# Patient Record
Sex: Female | Born: 1960
Health system: Southern US, Community
[De-identification: ages and names within clinical notes are randomized; demographics above are authoritative.]

## PROBLEM LIST (undated history)

## (undated) DIAGNOSIS — R9431 Abnormal electrocardiogram [ECG] [EKG]: Secondary | ICD-10-CM

## (undated) DIAGNOSIS — M1990 Unspecified osteoarthritis, unspecified site: Secondary | ICD-10-CM

## (undated) DIAGNOSIS — I10 Essential (primary) hypertension: Secondary | ICD-10-CM

## (undated) DIAGNOSIS — E119 Type 2 diabetes mellitus without complications: Secondary | ICD-10-CM

## (undated) DIAGNOSIS — Z01810 Encounter for preprocedural cardiovascular examination: Secondary | ICD-10-CM

## (undated) DIAGNOSIS — C50919 Malignant neoplasm of unspecified site of unspecified female breast: Secondary | ICD-10-CM

## (undated) HISTORY — PX: CHOLECYSTECTOMY: SHX55

## (undated) HISTORY — DX: Encounter for preprocedural cardiovascular examination: Z01.810

## (undated) HISTORY — PX: BREAST LUMPECTOMY: SHX2

## (undated) HISTORY — DX: Type 2 diabetes mellitus without complications: E11.9

## (undated) HISTORY — DX: Abnormal electrocardiogram (ECG) (EKG): R94.31

## (undated) HISTORY — DX: Essential (primary) hypertension: I10

## (undated) HISTORY — DX: Malignant neoplasm of unspecified site of unspecified female breast: C50.919

## (undated) HISTORY — PX: KNEE SURGERY: SHX244

---

## 2002-01-08 ENCOUNTER — Other Ambulatory Visit: Admission: RE | Admit: 2002-01-08 | Discharge: 2002-01-08 | Payer: Self-pay | Admitting: Family Medicine

## 2003-09-24 ENCOUNTER — Other Ambulatory Visit: Admission: RE | Admit: 2003-09-24 | Discharge: 2003-09-24 | Payer: Self-pay | Admitting: Family Medicine

## 2004-01-04 ENCOUNTER — Ambulatory Visit: Payer: Self-pay | Admitting: Family Medicine

## 2004-05-05 ENCOUNTER — Ambulatory Visit: Payer: Self-pay | Admitting: Family Medicine

## 2005-03-16 ENCOUNTER — Ambulatory Visit: Payer: Self-pay | Admitting: Family Medicine

## 2011-09-11 ENCOUNTER — Other Ambulatory Visit: Payer: Self-pay | Admitting: Vascular Surgery

## 2011-09-11 DIAGNOSIS — R928 Other abnormal and inconclusive findings on diagnostic imaging of breast: Secondary | ICD-10-CM

## 2011-09-15 ENCOUNTER — Ambulatory Visit
Admission: RE | Admit: 2011-09-15 | Discharge: 2011-09-15 | Disposition: A | Discharge status: Home / Self Care | Payer: BC Managed Care – PPO | Source: Ambulatory Visit | Attending: Vascular Surgery | Admitting: Vascular Surgery

## 2011-09-15 DIAGNOSIS — R928 Other abnormal and inconclusive findings on diagnostic imaging of breast: Secondary | ICD-10-CM

## 2014-11-11 DIAGNOSIS — Z853 Personal history of malignant neoplasm of breast: Secondary | ICD-10-CM | POA: Diagnosis not present

## 2015-05-11 DIAGNOSIS — I10 Essential (primary) hypertension: Secondary | ICD-10-CM | POA: Diagnosis not present

## 2015-05-11 DIAGNOSIS — E119 Type 2 diabetes mellitus without complications: Secondary | ICD-10-CM | POA: Diagnosis not present

## 2015-05-12 DIAGNOSIS — Z853 Personal history of malignant neoplasm of breast: Secondary | ICD-10-CM | POA: Diagnosis not present

## 2015-05-18 DIAGNOSIS — B372 Candidiasis of skin and nail: Secondary | ICD-10-CM | POA: Diagnosis not present

## 2015-05-18 DIAGNOSIS — I10 Essential (primary) hypertension: Secondary | ICD-10-CM | POA: Diagnosis not present

## 2015-05-18 DIAGNOSIS — E119 Type 2 diabetes mellitus without complications: Secondary | ICD-10-CM | POA: Diagnosis not present

## 2015-05-18 DIAGNOSIS — M1710 Unilateral primary osteoarthritis, unspecified knee: Secondary | ICD-10-CM | POA: Diagnosis not present

## 2015-08-18 DIAGNOSIS — C50219 Malignant neoplasm of upper-inner quadrant of unspecified female breast: Secondary | ICD-10-CM | POA: Diagnosis not present

## 2015-08-18 DIAGNOSIS — Z9889 Other specified postprocedural states: Secondary | ICD-10-CM | POA: Diagnosis not present

## 2015-08-18 DIAGNOSIS — R928 Other abnormal and inconclusive findings on diagnostic imaging of breast: Secondary | ICD-10-CM | POA: Diagnosis not present

## 2015-08-31 DIAGNOSIS — Z171 Estrogen receptor negative status [ER-]: Secondary | ICD-10-CM | POA: Diagnosis not present

## 2015-08-31 DIAGNOSIS — C50311 Malignant neoplasm of lower-inner quadrant of right female breast: Secondary | ICD-10-CM | POA: Diagnosis not present

## 2015-08-31 DIAGNOSIS — Z6841 Body Mass Index (BMI) 40.0 and over, adult: Secondary | ICD-10-CM | POA: Diagnosis not present

## 2015-09-14 DIAGNOSIS — M25561 Pain in right knee: Secondary | ICD-10-CM | POA: Diagnosis not present

## 2015-10-14 DIAGNOSIS — M1711 Unilateral primary osteoarthritis, right knee: Secondary | ICD-10-CM | POA: Diagnosis not present

## 2015-11-17 DIAGNOSIS — I10 Essential (primary) hypertension: Secondary | ICD-10-CM | POA: Diagnosis not present

## 2015-11-17 DIAGNOSIS — E119 Type 2 diabetes mellitus without complications: Secondary | ICD-10-CM | POA: Diagnosis not present

## 2015-11-18 DIAGNOSIS — L814 Other melanin hyperpigmentation: Secondary | ICD-10-CM | POA: Diagnosis not present

## 2015-11-18 DIAGNOSIS — D1801 Hemangioma of skin and subcutaneous tissue: Secondary | ICD-10-CM | POA: Diagnosis not present

## 2015-11-18 DIAGNOSIS — L578 Other skin changes due to chronic exposure to nonionizing radiation: Secondary | ICD-10-CM | POA: Diagnosis not present

## 2015-11-24 DIAGNOSIS — M1710 Unilateral primary osteoarthritis, unspecified knee: Secondary | ICD-10-CM | POA: Diagnosis not present

## 2015-11-24 DIAGNOSIS — E119 Type 2 diabetes mellitus without complications: Secondary | ICD-10-CM | POA: Diagnosis not present

## 2015-11-24 DIAGNOSIS — I10 Essential (primary) hypertension: Secondary | ICD-10-CM | POA: Diagnosis not present

## 2015-11-24 DIAGNOSIS — B372 Candidiasis of skin and nail: Secondary | ICD-10-CM | POA: Diagnosis not present

## 2015-11-29 DIAGNOSIS — Z853 Personal history of malignant neoplasm of breast: Secondary | ICD-10-CM | POA: Diagnosis not present

## 2016-04-05 DIAGNOSIS — E119 Type 2 diabetes mellitus without complications: Secondary | ICD-10-CM | POA: Diagnosis not present

## 2016-05-29 DIAGNOSIS — Z853 Personal history of malignant neoplasm of breast: Secondary | ICD-10-CM | POA: Diagnosis not present

## 2016-05-29 DIAGNOSIS — Z170 Estrogen receptor positive status [ER+]: Secondary | ICD-10-CM | POA: Diagnosis not present

## 2016-08-25 DIAGNOSIS — R922 Inconclusive mammogram: Secondary | ICD-10-CM | POA: Diagnosis not present

## 2016-08-25 DIAGNOSIS — Z9889 Other specified postprocedural states: Secondary | ICD-10-CM | POA: Diagnosis not present

## 2016-08-25 DIAGNOSIS — C50211 Malignant neoplasm of upper-inner quadrant of right female breast: Secondary | ICD-10-CM | POA: Diagnosis not present

## 2016-09-04 DIAGNOSIS — C50311 Malignant neoplasm of lower-inner quadrant of right female breast: Secondary | ICD-10-CM | POA: Diagnosis not present

## 2016-09-04 DIAGNOSIS — Z171 Estrogen receptor negative status [ER-]: Secondary | ICD-10-CM | POA: Diagnosis not present

## 2016-09-04 DIAGNOSIS — Z6838 Body mass index (BMI) 38.0-38.9, adult: Secondary | ICD-10-CM | POA: Diagnosis not present

## 2016-11-28 DIAGNOSIS — Z170 Estrogen receptor positive status [ER+]: Secondary | ICD-10-CM | POA: Diagnosis not present

## 2016-11-28 DIAGNOSIS — Z853 Personal history of malignant neoplasm of breast: Secondary | ICD-10-CM | POA: Diagnosis not present

## 2017-01-11 DIAGNOSIS — I10 Essential (primary) hypertension: Secondary | ICD-10-CM | POA: Diagnosis not present

## 2017-01-11 DIAGNOSIS — E119 Type 2 diabetes mellitus without complications: Secondary | ICD-10-CM | POA: Diagnosis not present

## 2017-01-11 DIAGNOSIS — Z79899 Other long term (current) drug therapy: Secondary | ICD-10-CM | POA: Diagnosis not present

## 2017-01-11 DIAGNOSIS — Z13220 Encounter for screening for lipoid disorders: Secondary | ICD-10-CM | POA: Diagnosis not present

## 2017-01-26 DIAGNOSIS — Z23 Encounter for immunization: Secondary | ICD-10-CM | POA: Diagnosis not present

## 2017-04-09 DIAGNOSIS — E785 Hyperlipidemia, unspecified: Secondary | ICD-10-CM | POA: Diagnosis not present

## 2017-04-09 DIAGNOSIS — I10 Essential (primary) hypertension: Secondary | ICD-10-CM | POA: Diagnosis not present

## 2017-04-09 DIAGNOSIS — E119 Type 2 diabetes mellitus without complications: Secondary | ICD-10-CM | POA: Diagnosis not present

## 2017-06-06 DIAGNOSIS — M25561 Pain in right knee: Secondary | ICD-10-CM | POA: Diagnosis not present

## 2017-06-06 DIAGNOSIS — M1711 Unilateral primary osteoarthritis, right knee: Secondary | ICD-10-CM | POA: Diagnosis not present

## 2017-06-06 DIAGNOSIS — M1712 Unilateral primary osteoarthritis, left knee: Secondary | ICD-10-CM | POA: Diagnosis not present

## 2017-06-06 DIAGNOSIS — M25562 Pain in left knee: Secondary | ICD-10-CM | POA: Diagnosis not present

## 2017-06-11 DIAGNOSIS — E113293 Type 2 diabetes mellitus with mild nonproliferative diabetic retinopathy without macular edema, bilateral: Secondary | ICD-10-CM | POA: Diagnosis not present

## 2017-07-10 DIAGNOSIS — M1711 Unilateral primary osteoarthritis, right knee: Secondary | ICD-10-CM | POA: Diagnosis not present

## 2017-07-10 DIAGNOSIS — E785 Hyperlipidemia, unspecified: Secondary | ICD-10-CM | POA: Diagnosis not present

## 2017-07-10 DIAGNOSIS — I10 Essential (primary) hypertension: Secondary | ICD-10-CM | POA: Diagnosis not present

## 2017-07-10 DIAGNOSIS — M1712 Unilateral primary osteoarthritis, left knee: Secondary | ICD-10-CM | POA: Diagnosis not present

## 2017-07-10 DIAGNOSIS — E119 Type 2 diabetes mellitus without complications: Secondary | ICD-10-CM | POA: Diagnosis not present

## 2017-07-10 DIAGNOSIS — Z79899 Other long term (current) drug therapy: Secondary | ICD-10-CM | POA: Diagnosis not present

## 2017-07-17 DIAGNOSIS — M1712 Unilateral primary osteoarthritis, left knee: Secondary | ICD-10-CM | POA: Diagnosis not present

## 2017-07-17 DIAGNOSIS — M1711 Unilateral primary osteoarthritis, right knee: Secondary | ICD-10-CM | POA: Diagnosis not present

## 2017-07-24 DIAGNOSIS — M1711 Unilateral primary osteoarthritis, right knee: Secondary | ICD-10-CM | POA: Diagnosis not present

## 2017-07-24 DIAGNOSIS — M1712 Unilateral primary osteoarthritis, left knee: Secondary | ICD-10-CM | POA: Diagnosis not present

## 2017-07-26 DIAGNOSIS — E119 Type 2 diabetes mellitus without complications: Secondary | ICD-10-CM | POA: Diagnosis not present

## 2017-08-27 DIAGNOSIS — C50311 Malignant neoplasm of lower-inner quadrant of right female breast: Secondary | ICD-10-CM | POA: Diagnosis not present

## 2017-08-27 DIAGNOSIS — Z9889 Other specified postprocedural states: Secondary | ICD-10-CM | POA: Diagnosis not present

## 2017-08-27 DIAGNOSIS — Z853 Personal history of malignant neoplasm of breast: Secondary | ICD-10-CM | POA: Diagnosis not present

## 2017-09-04 DIAGNOSIS — M1712 Unilateral primary osteoarthritis, left knee: Secondary | ICD-10-CM | POA: Diagnosis not present

## 2017-09-04 DIAGNOSIS — M1711 Unilateral primary osteoarthritis, right knee: Secondary | ICD-10-CM | POA: Diagnosis not present

## 2017-09-20 DIAGNOSIS — E119 Type 2 diabetes mellitus without complications: Secondary | ICD-10-CM | POA: Diagnosis not present

## 2017-09-20 DIAGNOSIS — Z6838 Body mass index (BMI) 38.0-38.9, adult: Secondary | ICD-10-CM | POA: Diagnosis not present

## 2017-09-20 DIAGNOSIS — Z01818 Encounter for other preprocedural examination: Secondary | ICD-10-CM | POA: Diagnosis not present

## 2017-09-20 DIAGNOSIS — I10 Essential (primary) hypertension: Secondary | ICD-10-CM | POA: Diagnosis not present

## 2017-09-24 DIAGNOSIS — C50919 Malignant neoplasm of unspecified site of unspecified female breast: Secondary | ICD-10-CM

## 2017-09-24 DIAGNOSIS — I10 Essential (primary) hypertension: Secondary | ICD-10-CM

## 2017-09-24 DIAGNOSIS — R9431 Abnormal electrocardiogram [ECG] [EKG]: Secondary | ICD-10-CM

## 2017-09-24 DIAGNOSIS — E119 Type 2 diabetes mellitus without complications: Secondary | ICD-10-CM

## 2017-09-24 DIAGNOSIS — Z01810 Encounter for preprocedural cardiovascular examination: Secondary | ICD-10-CM

## 2017-09-24 HISTORY — DX: Type 2 diabetes mellitus without complications: E11.9

## 2017-09-24 HISTORY — DX: Essential (primary) hypertension: I10

## 2017-09-24 HISTORY — DX: Malignant neoplasm of unspecified site of unspecified female breast: C50.919

## 2017-09-24 HISTORY — DX: Abnormal electrocardiogram (ECG) (EKG): R94.31

## 2017-09-24 HISTORY — DX: Encounter for preprocedural cardiovascular examination: Z01.810

## 2017-10-01 NOTE — Progress Notes (Addendum)
Cardiology Office Note:    Date:  10/02/2017   ID:  Samantha Potter, DOB 09-Mar-1960, MRN [id number]  PCP:  Ernestene Kiel, MD  Cardiologist:  Shirlee More, MD   Referring MD: Janie Morning, DO  ASSESSMENT:    1. Abnormal EKG   2. Preop cardiovascular exam   3. Essential hypertension   4. Type 2 diabetes mellitus with complication, without long-term current use of insulin ([place])   5. Hx antineoplastic chemo    PLAN:    In order of problems listed above:   Echo is normal, proceed with TKA.  1. Her EKG is nonspecific but with her history of chemotherapy and radiation would like her to have an echocardiogram and if function is normal and no valvular abnormality noted proceed with her planned total knee arthroplasty and I do not think she needs further evaluation such as an ischemic test stress test. 2. The surgery is intermediate risk and elective she has no known heart disease but is nonspecific EKG changes and history of combined chemotherapy and radiation.  Her exercise tolerance is well preserved she will undergo an echocardiogram and if reassuring proceed with surgery 3. Stable continue lisinopril 4. Stable continue metformin 5. An increased cardiovascular risk echocardiogram is appropriate if reassuring proceed with her planned total knee arthroplasty.  I do not think she needs further evaluation such as a stress test  Next appointment as needed   Medication Adjustments/Labs and Tests Ordered: Current medicines are reviewed at length with the patient today.  Concerns regarding medicines are outlined above.  Orders Placed This Encounter  Procedures  . EKG 12-Lead  . ECHOCARDIOGRAM COMPLETE   No orders of the defined types were placed in this encounter.    Chief Complaint  Patient presents with  . New Patient (Initial Visit)  . Abnormal ECG  I am awaiting total knee arthroplasty  History of Present Illness:    Samantha Potter is a 57 y.o. female who is being  seen today for preoperative evaluation for TKA at the request of  Bertram Savin (Attending) [phone] (Work) [address] [place], [place] [id number] Orthopaedic Surgery  Preoperative EKG was read as abnormal old anterior MI my interpretation is normal  She has no known history of heart disease but received chemotherapy in 2013 likely Adriamycin for breast cancer combined with radiation is at increased cardiovascular risk.  She has had no exercise intolerance dyspnea chest pain palpitations syncope or TIA she still remains active despite her knee pain and limitations in her exercise tolerance is 5 mets or greater.  She has no history of congenital rheumatic heart disease or other risk factors include hypertension and type 2 diabetes Past Medical History:  Diagnosis Date  . Abnormal EKG 09/24/2017  . Breast cancer ([place]) 09/24/2017  . Diabetes ([place]) 09/24/2017  . Essential hypertension 09/24/2017  . Preop cardiovascular exam 09/24/2017    Past Surgical History:  Procedure Laterality Date  . BREAST LUMPECTOMY    . CHOLECYSTECTOMY    . KNEE SURGERY      Current Medications: Current Meds  Medication Sig  . acetaminophen (TYLENOL) 500 MG tablet Take 500 mg by mouth 2 (two) times daily.  Marland Kitchen CINNAMON PO Take 1 tablet by mouth daily.  . cyclobenzaprine (FLEXERIL) 5 MG tablet Take 5 mg by mouth 3 (three) times daily as needed.  Marland Kitchen lisinopril (PRINIVIL,ZESTRIL) 5 MG tablet Take 5 mg by mouth daily.  . metFORMIN (GLUCOPHAGE) 500 MG tablet Take 2 tablets by mouth  2 (two) times daily.     Allergies:   Penicillins   Social History   Socioeconomic History  . Marital status: Married    Spouse name: Not on file  . Number of children: Not on file  . Years of education: Not on file  . Highest education level: Not on file  Occupational History  . Not on file  Social Needs  . Financial resource strain: Not on file  . Food insecurity:    Worry: Not on file    Inability: Not  on file  . Transportation needs:    Medical: Not on file    Non-medical: Not on file  Tobacco Use  . Smoking status: Never Smoker  . Smokeless tobacco: Never Used  Substance and Sexual Activity  . Alcohol use: Never    Frequency: Never  . Drug use: Never  . Sexual activity: Not on file  Lifestyle  . Physical activity:    Days per week: Not on file    Minutes per session: Not on file  . Stress: Not on file  Relationships  . Social connections:    Talks on phone: Not on file    Gets together: Not on file    Attends religious service: Not on file    Active member of club or organization: Not on file    Attends meetings of clubs or organizations: Not on file    Relationship status: Not on file  Other Topics Concern  . Not on file  Social History Narrative  . Not on file     Family History: The patient's family history includes Bladder Cancer in her father; Diabetes in her paternal aunt, paternal grandmother, and paternal uncle; Heart disease in her paternal uncle; Hypertension in her mother and paternal uncle.  ROS:   Review of Systems  Constitution: Negative.  HENT: Negative.   Eyes: Negative.   Cardiovascular: Negative.   Respiratory: Negative.   Endocrine: Negative.   Hematologic/Lymphatic: Negative.   Skin: Negative.   Musculoskeletal: Positive for joint pain.  Gastrointestinal: Negative.   Genitourinary: Negative.   Neurological: Negative.   Psychiatric/Behavioral: Negative.   Allergic/Immunologic: Negative.    Please see the history of present illness.     All other systems reviewed and are negative.  EKGs/Labs/Other Studies Reviewed:    The following studies were reviewed today:   EKG:  EKG is  ordered today.  The ekg ordered today demonstrates sinus rhythm poor R wave progression variant of normal  Recent Labs: No results found for requested labs within last 8760 hours.  Recent Lipid Panel No results found for: CHOL, TRIG, HDL, CHOLHDL, VLDL,  LDLCALC, LDLDIRECT  Physical Exam:    VS:  BP (!) 150/90 (BP Location: Right Arm, Patient Position: Sitting, Cuff Size: Large)   Pulse 94   Ht 5\' 4"  (1.626 m)   Wt 223 lb 3.2 oz (101.2 kg)   SpO2 98%   BMI 38.31 kg/m     Wt Readings from Last 3 Encounters:  10/02/17 223 lb 3.2 oz (101.2 kg)     GEN:  Well nourished, well developed in no acute distress HEENT: Normal NECK: No JVD; No carotid bruits LYMPHATICS: No lymphadenopathy CARDIAC: RRR, no murmurs, rubs, gallops RESPIRATORY:  Clear to auscultation without rales, wheezing or rhonchi  ABDOMEN: Soft, non-tender, non-distended MUSCULOSKELETAL:  No edema; No deformity  SKIN: Warm and dry NEUROLOGIC:  Alert and oriented x 3 PSYCHIATRIC:  Normal affect     Signed, Shirlee More, MD  10/02/2017 9:39 AM     [facility]

## 2017-10-02 ENCOUNTER — Encounter: Payer: Self-pay | Admitting: Cardiology

## 2017-10-02 ENCOUNTER — Encounter (INDEPENDENT_AMBULATORY_CARE_PROVIDER_SITE_OTHER): Payer: Self-pay

## 2017-10-02 ENCOUNTER — Ambulatory Visit: Payer: BLUE CROSS/BLUE SHIELD | Admitting: Cardiology

## 2017-10-02 VITALS — BP 140/88 | HR 94 | Ht 64.0 in | Wt 223.2 lb

## 2017-10-02 DIAGNOSIS — I10 Essential (primary) hypertension: Secondary | ICD-10-CM | POA: Diagnosis not present

## 2017-10-02 DIAGNOSIS — E118 Type 2 diabetes mellitus with unspecified complications: Secondary | ICD-10-CM

## 2017-10-02 DIAGNOSIS — R9431 Abnormal electrocardiogram [ECG] [EKG]: Secondary | ICD-10-CM | POA: Diagnosis not present

## 2017-10-02 DIAGNOSIS — Z9221 Personal history of antineoplastic chemotherapy: Secondary | ICD-10-CM

## 2017-10-02 DIAGNOSIS — Z01810 Encounter for preprocedural cardiovascular examination: Secondary | ICD-10-CM

## 2017-10-02 NOTE — Patient Instructions (Signed)
Medication Instructions:  Your physician recommends that you continue on your current medications as directed. Please refer to the Current Medication list given to you today.   Labwork: None.  Testing/Procedures: Your physician has requested that you have an echocardiogram. Echocardiography is a painless test that uses sound waves to create images of your heart. It provides your doctor with information about the size and shape of your heart and how well your heart's chambers and valves are working. This procedure takes approximately one hour. There are no restrictions for this procedure.    Follow-Up: Follow up as needed.   Any Other Special Instructions Will Be Listed Below (If Applicable).     If you need a refill on your cardiac medications before your next appointment, please call your pharmacy.   Echocardiogram An echocardiogram, or echocardiography, uses sound waves (ultrasound) to produce an image of your heart. The echocardiogram is simple, painless, obtained within a short period of time, and offers valuable information to your health care provider. The images from an echocardiogram can provide information such as:  Evidence of coronary artery disease (CAD).  Heart size.  Heart muscle function.  Heart valve function.  Aneurysm detection.  Evidence of a past heart attack.  Fluid buildup around the heart.  Heart muscle thickening.  Assess heart valve function.  Tell a health care provider about:  Any allergies you have.  All medicines you are taking, including vitamins, herbs, eye drops, creams, and over-the-counter medicines.  Any problems you or family members have had with anesthetic medicines.  Any blood disorders you have.  Any surgeries you have had.  Any medical conditions you have.  Whether you are pregnant or may be pregnant. What happens before the procedure? No special preparation is needed. Eat and drink normally. What happens during the  procedure?  In order to produce an image of your heart, gel will be applied to your chest and a wand-like tool (transducer) will be moved over your chest. The gel will help transmit the sound waves from the transducer. The sound waves will harmlessly bounce off your heart to allow the heart images to be captured in real-time motion. These images will then be recorded.  You may need an IV to receive a medicine that improves the quality of the pictures. What happens after the procedure? You may return to your normal schedule including diet, activities, and medicines, unless your health care provider tells you otherwise. This information is not intended to replace advice given to you by your health care provider. Make sure you discuss any questions you have with your health care provider. Document Released: 01/21/2000 Document Revised: 09/11/2015 Document Reviewed: 09/30/2012 Elsevier Interactive Patient Education  2017 [employer].

## 2017-10-10 DIAGNOSIS — Z79899 Other long term (current) drug therapy: Secondary | ICD-10-CM | POA: Diagnosis not present

## 2017-10-10 DIAGNOSIS — E785 Hyperlipidemia, unspecified: Secondary | ICD-10-CM | POA: Diagnosis not present

## 2017-10-10 DIAGNOSIS — Z6838 Body mass index (BMI) 38.0-38.9, adult: Secondary | ICD-10-CM | POA: Diagnosis not present

## 2017-10-10 DIAGNOSIS — E119 Type 2 diabetes mellitus without complications: Secondary | ICD-10-CM | POA: Diagnosis not present

## 2017-10-11 ENCOUNTER — Ambulatory Visit (HOSPITAL_BASED_OUTPATIENT_CLINIC_OR_DEPARTMENT_OTHER)
Admission: RE | Admit: 2017-10-11 | Discharge: 2017-10-11 | Disposition: A | Discharge status: Home / Self Care | Payer: BLUE CROSS/BLUE SHIELD | Source: Ambulatory Visit | Attending: Cardiology | Admitting: Cardiology

## 2017-10-11 DIAGNOSIS — E119 Type 2 diabetes mellitus without complications: Secondary | ICD-10-CM | POA: Diagnosis not present

## 2017-10-11 DIAGNOSIS — R9431 Abnormal electrocardiogram [ECG] [EKG]: Secondary | ICD-10-CM | POA: Insufficient documentation

## 2017-10-11 DIAGNOSIS — I119 Hypertensive heart disease without heart failure: Secondary | ICD-10-CM | POA: Diagnosis not present

## 2017-10-11 NOTE — Progress Notes (Signed)
  Echocardiogram 2D Echocardiogram has been performed.  Samantha Potter T Fe Okubo 10/11/2017, 11:30 AM

## 2017-11-01 ENCOUNTER — Telehealth: Payer: Self-pay | Admitting: Cardiology

## 2017-11-01 NOTE — Telephone Encounter (Signed)
Patient states that knee surgeon needs clearance for Knee surgery by Dr Rod Can and the patient states they have not heard anything and they need information.

## 2017-11-01 NOTE — Telephone Encounter (Signed)
Patient informed that Dr. Bettina Gavia had informed Dr. Lyla Glassing that she was cleared for surgery from a cardiac standpoint on 10/11/17 after echocardiogram. Advised patient that all of this information has been resent to Dr. Lyla Glassing and she should contact his office tomorrow to make sure they received it. Advised her to contact our office with any further questions or concerns. Patient verbalized understanding. No further questions.

## 2017-11-09 ENCOUNTER — Ambulatory Visit: Payer: Self-pay | Admitting: Orthopedic Surgery

## 2017-11-12 NOTE — Patient Instructions (Addendum)
Samantha Potter  11/12/2017   Your procedure is scheduled on: 11-22-17   Report to [facility] Main  Entrance     Report to admitting at 8:00AM    Call this number if you have problems the morning of surgery [phone]     Remember: Do not eat food or drink liquids :After Midnight. BRUSH YOUR TEETH MORNING OF SURGERY AND RINSE YOUR MOUTH OUT, NO CHEWING GUM CANDY OR MINTS.     Take these medicines the morning of surgery with A SIP OF WATER: tylenol if needed               You may not have any metal on your body including hair pins and              piercings  Do not wear jewelry, make-up, lotions, powders or perfumes, deodorant             Do not wear nail polish.  Do not shave  48 hours prior to surgery.     Do not bring valuables to the hospital. [place].  Contacts, dentures or bridgework may not be worn into surgery.  Leave suitcase in the car. After surgery it may be brought to your room.                   Please read over the following fact sheets you were given: _____________________________________________________________________             [facility] - Preparing for Surgery Before surgery, you can play an important role.  Because skin is not sterile, your skin needs to be as free of germs as possible.  You can reduce the number of germs on your skin by washing with CHG (chlorahexidine gluconate) soap before surgery.  CHG is an antiseptic cleaner which kills germs and bonds with the skin to continue killing germs even after washing. Please DO NOT use if you have an allergy to CHG or antibacterial soaps.  If your skin becomes reddened/irritated stop using the CHG and inform your nurse when you arrive at Short Stay. Do not shave (including legs and underarms) for at least 48 hours prior to the first CHG shower.  You may shave your face/neck. Please follow these instructions carefully:  1.   Shower with CHG Soap the night before surgery and the  morning of Surgery.  2.  If you choose to wash your hair, wash your hair first as usual with your  normal  shampoo.  3.  After you shampoo, rinse your hair and body thoroughly to remove the  shampoo.                           4.  Use CHG as you would any other liquid soap.  You can apply chg directly  to the skin and wash                       Gently with a scrungie or clean washcloth.  5.  Apply the CHG Soap to your body ONLY FROM THE NECK DOWN.   Do not use on face/ open  Wound or open sores. Avoid contact with eyes, ears mouth and genitals (private parts).                       Wash face,  Genitals (private parts) with your normal soap.             6.  Wash thoroughly, paying special attention to the area where your surgery  will be performed.  7.  Thoroughly rinse your body with warm water from the neck down.  8.  DO NOT shower/wash with your normal soap after using and rinsing off  the CHG Soap.                9.  Pat yourself dry with a clean towel.            10.  Wear clean pajamas.            11.  Place clean sheets on your bed the night of your first shower and do not  sleep with pets. Day of Surgery : Do not apply any lotions/deodorants the morning of surgery.  Please wear clean clothes to the hospital/surgery center.  FAILURE TO FOLLOW THESE INSTRUCTIONS MAY RESULT IN THE CANCELLATION OF YOUR SURGERY PATIENT SIGNATURE_________________________________  NURSE SIGNATURE__________________________________  ________________________________________________________________________   Samantha Potter  An incentive spirometer is a tool that can help keep your lungs clear and active. This tool measures how well you are filling your lungs with each breath. Taking long deep breaths may help reverse or decrease the chance of developing breathing (pulmonary) problems (especially infection) following:  A long  period of time when you are unable to move or be active. BEFORE THE PROCEDURE   If the spirometer includes an indicator to show your best effort, your nurse or respiratory therapist will set it to a desired goal.  If possible, sit up straight or lean slightly forward. Try not to slouch.  Hold the incentive spirometer in an upright position. INSTRUCTIONS FOR USE  1. Sit on the edge of your bed if possible, or sit up as far as you can in bed or on a chair. 2. Hold the incentive spirometer in an upright position. 3. Breathe out normally. 4. Place the mouthpiece in your mouth and seal your lips tightly around it. 5. Breathe in slowly and as deeply as possible, raising the piston or the ball toward the top of the column. 6. Hold your breath for 3-5 seconds or for as long as possible. Allow the piston or ball to fall to the bottom of the column. 7. Remove the mouthpiece from your mouth and breathe out normally. 8. Rest for a few seconds and repeat Steps 1 through 7 at least 10 times every 1-2 hours when you are awake. Take your time and take a few normal breaths between deep breaths. 9. The spirometer may include an indicator to show your best effort. Use the indicator as a goal to work toward during each repetition. 10. After each set of 10 deep breaths, practice coughing to be sure your lungs are clear. If you have an incision (the cut made at the time of surgery), support your incision when coughing by placing a pillow or rolled up towels firmly against it. Once you are able to get out of bed, walk around indoors and cough well. You may stop using the incentive spirometer when instructed by your caregiver.  RISKS AND COMPLICATIONS  Take your time so you do not get  dizzy or light-headed.  If you are in pain, you may need to take or ask for pain medication before doing incentive spirometry. It is harder to take a deep breath if you are having pain. AFTER USE  Rest and breathe slowly and  easily.  It can be helpful to keep track of a log of your progress. Your caregiver can provide you with a simple table to help with this. If you are using the spirometer at home, follow these instructions: [place] IF:   You are having difficultly using the spirometer.  You have trouble using the spirometer as often as instructed.  Your pain medication is not giving enough relief while using the spirometer.  You develop fever of 100.5 F (38.1 C) or higher. SEEK IMMEDIATE MEDICAL CARE IF:   You cough up bloody sputum that had not been present before.  You develop fever of 102 F (38.9 C) or greater.  You develop worsening pain at or near the incision site. MAKE SURE YOU:   Understand these instructions.  Will watch your condition.  Will get help right away if you are not doing well or get worse. Document Released: 06/05/2006 Document Revised: 04/17/2011 Document Reviewed: 08/06/2006 ExitCare Patient Information 2014 ExitCare, [place].   ________________________________________________________________________  WHAT IS A BLOOD TRANSFUSION? Blood Transfusion Information  A transfusion is the replacement of blood or some of its parts. Blood is made up of multiple cells which provide different functions.  Red blood cells carry oxygen and are used for blood loss replacement.  White blood cells fight against infection.  Platelets control bleeding.  Plasma helps clot blood.  Other blood products are available for specialized needs, such as hemophilia or other clotting disorders. BEFORE THE TRANSFUSION  Who gives blood for transfusions?   Healthy volunteers who are fully evaluated to make sure their blood is safe. This is blood bank blood. Transfusion therapy is the safest it has ever been in the practice of medicine. Before blood is taken from a donor, a complete history is taken to make sure that person has no history of diseases nor engages in risky social  behavior (examples are intravenous drug use or sexual activity with multiple partners). The donor's travel history is screened to minimize risk of transmitting infections, such as malaria. The donated blood is tested for signs of infectious diseases, such as HIV and hepatitis. The blood is then tested to be sure it is compatible with you in order to minimize the chance of a transfusion reaction. If you or a relative donates blood, this is often done in anticipation of surgery and is not appropriate for emergency situations. It takes many days to process the donated blood. RISKS AND COMPLICATIONS Although transfusion therapy is very safe and saves many lives, the main dangers of transfusion include:   Getting an infectious disease.  Developing a transfusion reaction. This is an allergic reaction to something in the blood you were given. Every precaution is taken to prevent this. The decision to have a blood transfusion has been considered carefully by your caregiver before blood is given. Blood is not given unless the benefits outweigh the risks. AFTER THE TRANSFUSION  Right after receiving a blood transfusion, you will usually feel much better and more energetic. This is especially true if your red blood cells have gotten low (anemic). The transfusion raises the level of the red blood cells which carry oxygen, and this usually causes an energy increase.  The nurse administering the transfusion will  monitor you carefully for complications. HOME CARE INSTRUCTIONS  No special instructions are needed after a transfusion. You may find your energy is better. Speak with your caregiver about any limitations on activity for underlying diseases you may have. SEEK MEDICAL CARE IF:   Your condition is not improving after your transfusion.  You develop redness or irritation at the intravenous (IV) site. SEEK IMMEDIATE MEDICAL CARE IF:  Any of the following symptoms occur over the next 12 hours:  Shaking  chills.  You have a temperature by mouth above 102 F (38.9 C), not controlled by medicine.  Chest, back, or muscle pain.  People around you feel you are not acting correctly or are confused.  Shortness of breath or difficulty breathing.  Dizziness and fainting.  You get a rash or develop hives.  You have a decrease in urine output.  Your urine turns a dark color or changes to pink, red, or brown. Any of the following symptoms occur over the next 10 days:  You have a temperature by mouth above 102 F (38.9 C), not controlled by medicine.  Shortness of breath.  Weakness after normal activity.  The white part of the eye turns yellow (jaundice).  You have a decrease in the amount of urine or are urinating less often.  Your urine turns a dark color or changes to pink, red, or brown. Document Released: 01/21/2000 Document Revised: 04/17/2011 Document Reviewed: 09/09/2007 [facility] Patient Information 2014 [place], [place].  _______________________________________________________________________

## 2017-11-12 NOTE — Progress Notes (Signed)
Cardiac clearance Dr Shirlee More on chart Medical clearance , Haydee Salter FNP-C on chart   EKG 10-02-17 epic   ECHO 10-11-17 epic

## 2017-11-13 ENCOUNTER — Ambulatory Visit: Payer: Self-pay | Admitting: Orthopedic Surgery

## 2017-11-13 NOTE — H&P (View-Only) (Signed)
TOTAL KNEE ADMISSION H&P  Patient is being admitted for right total knee arthroplasty.  Subjective:  Chief Complaint:right knee pain.  HPI: Samantha Potter, 57 y.o. female, has a history of pain and functional disability in the right knee due to arthritis and has failed non-surgical conservative treatments for greater than 12 weeks to includeNSAID's and/or analgesics, corticosteriod injections, flexibility and strengthening excercises, use of assistive devices, weight reduction as appropriate and activity modification.  Onset of symptoms was gradual, starting >10 years ago with gradually worsening course since that time. The patient noted prior procedures on the knee to include  arthroscopy on the right knee(s).  Patient currently rates pain in the right knee(s) at 10 out of 10 with activity. Patient has night pain, worsening of pain with activity and weight bearing, pain that interferes with activities of daily living, pain with passive range of motion, crepitus and joint swelling.  Patient has evidence of subchondral cysts, subchondral sclerosis, periarticular osteophytes and joint space narrowing by imaging studies.  There is no active infection.  Patient Active Problem List   Diagnosis Date Noted  . Hx antineoplastic chemo 10/02/2017  . Preop cardiovascular exam 09/24/2017  . Abnormal EKG 09/24/2017  . Essential hypertension 09/24/2017  . Diabetes ([place]) 09/24/2017  . Breast cancer ([place]) 09/24/2017   Past Medical History:  Diagnosis Date  . Abnormal EKG 09/24/2017  . Breast cancer ([place]) 09/24/2017  . Diabetes ([place]) 09/24/2017  . Essential hypertension 09/24/2017  . Preop cardiovascular exam 09/24/2017    Past Surgical History:  Procedure Laterality Date  . BREAST LUMPECTOMY    . CHOLECYSTECTOMY    . KNEE SURGERY      Current Outpatient Medications  Medication Sig Dispense Refill Last Dose  . acetaminophen (TYLENOL) 500 MG tablet Take 500 mg by mouth 2 (two) times daily.   Taking   . CINNAMON PO Take 1 tablet by mouth daily.   Taking  . lisinopril (PRINIVIL,ZESTRIL) 5 MG tablet Take 5 mg by mouth daily.  0 Taking  . Menthol, Topical Analgesic, (BIOFREEZE EX) Apply 1 application topically 3 (three) times daily as needed (pain.).     Marland Kitchen metFORMIN (GLUCOPHAGE) 500 MG tablet Take 1,000 mg by mouth 2 (two) times daily.   1 Taking  . Multiple Vitamin (MULTIVITAMIN WITH MINERALS) TABS tablet Take 1 tablet by mouth daily.     Marland Kitchen OVER THE COUNTER MEDICATION Take 1 capsule by mouth See admin instructions. Take 1 capsule by mouth 2-3 times daily with meals. Sucontral D (B-vitamins, folic acid, vitamins C and E, as well as trace minerals zinc and chromium)      No current facility-administered medications for this visit.    Allergies  Allergen Reactions  . Penicillins Hives    Has patient had a PCN reaction causing immediate rash, facial/tongue/throat swelling, SOB or lightheadedness with hypotension: No Has patient had a PCN reaction causing severe rash involving mucus membranes or skin necrosis: No Has patient had a PCN reaction that required hospitalization: No Has patient had a PCN reaction occurring within the last 10 years: No If all of the above answers are "NO", then may proceed with Cephalosporin use.     Social History   Tobacco Use  . Smoking status: Never Smoker  . Smokeless tobacco: Never Used  Substance Use Topics  . Alcohol use: Never    Frequency: Never    Family History  Problem Relation Age of Onset  . Hypertension Mother   . Bladder Cancer Father   .  Diabetes Paternal Grandmother   . Diabetes Paternal Uncle   . Hypertension Paternal Uncle   . Heart disease Paternal Uncle   . Diabetes Paternal Aunt      Review of Systems  Constitutional: Negative.   HENT: Negative.   Eyes: Negative.   Respiratory: Negative.   Cardiovascular: Negative.   Gastrointestinal: Negative.   Genitourinary: Negative.   Musculoskeletal: Positive for joint pain.  Skin:  Negative.   Neurological: Negative.   Endo/Heme/Allergies: Negative.   Psychiatric/Behavioral: Negative.     Objective:  Physical Exam  Vitals reviewed. Constitutional: She is oriented to person, place, and time. She appears well-developed and well-nourished.  HENT:  Head: Normocephalic and atraumatic.  Eyes: Pupils are equal, round, and reactive to light. Conjunctivae and EOM are normal.  Neck: Normal range of motion. Neck supple.  Cardiovascular: Normal rate, regular rhythm and intact distal pulses.  Respiratory: Effort normal. No respiratory distress.  GI: Soft. She exhibits no distension.  Genitourinary:  Genitourinary Comments: deferred  Musculoskeletal:       Right knee: She exhibits decreased range of motion, swelling and effusion. Tenderness found. Medial joint line and lateral joint line tenderness noted.  Neurological: She is alert and oriented to person, place, and time. She has normal reflexes.  Skin: Skin is warm and dry.  Psychiatric: She has a normal mood and affect. Her behavior is normal. Judgment and thought content normal.    Vital signs in last 24 hours: @VSRANGES @  Labs:   Estimated body mass index is 38.31 kg/m as calculated from the following:   Height as of 10/02/17: 5\' 4"  (1.626 m).   Weight as of 10/02/17: 101.2 kg.   Imaging Review Plain radiographs demonstrate severe degenerative joint disease of the right knee(s). The overall alignment issignificant varus. The bone quality appears to be adequate for age and reported activity level.   Preoperative templating of the joint replacement has been completed, documented, and submitted to the Operating Room personnel in order to optimize intra-operative equipment management.   Anticipated LOS equal to or greater than 2 midnights due to - Age 48 and older with one or more of the following:  - Obesity  - Expected need for hospital services (PT, OT, Nursing) required for safe  discharge  - Anticipated  need for postoperative skilled nursing care or inpatient rehab  - Active co-morbidities: Diabetes OR   - Unanticipated findings during/Post Surgery: None  - Patient is a high risk of re-admission due to: None     Assessment/Plan:  End stage arthritis, right knee   The patient history, physical examination, clinical judgment of the provider and imaging studies are consistent with end stage degenerative joint disease of the right knee(s) and total knee arthroplasty is deemed medically necessary. The treatment options including medical management, injection therapy arthroscopy and arthroplasty were discussed at length. The risks and benefits of total knee arthroplasty were presented and reviewed. The risks due to aseptic loosening, infection, stiffness, patella tracking problems, thromboembolic complications and other imponderables were discussed. The patient acknowledged the explanation, agreed to proceed with the plan and consent was signed. Patient is being admitted for inpatient treatment for surgery, pain control, PT, OT, prophylactic antibiotics, VTE prophylaxis, progressive ambulation and ADL's and discharge planning. The patient is planning to be discharged home with outpatient PT @ [facility]. Has walker.

## 2017-11-13 NOTE — H&P (Signed)
TOTAL KNEE ADMISSION H&P  Patient is being admitted for right total knee arthroplasty.  Subjective:  Chief Complaint:right knee pain.  HPI: Samantha Potter, 57 y.o. female, has a history of pain and functional disability in the right knee due to arthritis and has failed non-surgical conservative treatments for greater than 12 weeks to includeNSAID's and/or analgesics, corticosteriod injections, flexibility and strengthening excercises, use of assistive devices, weight reduction as appropriate and activity modification.  Onset of symptoms was gradual, starting >10 years ago with gradually worsening course since that time. The patient noted prior procedures on the knee to include  arthroscopy on the right knee(s).  Patient currently rates pain in the right knee(s) at 10 out of 10 with activity. Patient has night pain, worsening of pain with activity and weight bearing, pain that interferes with activities of daily living, pain with passive range of motion, crepitus and joint swelling.  Patient has evidence of subchondral cysts, subchondral sclerosis, periarticular osteophytes and joint space narrowing by imaging studies.  There is no active infection.  Patient Active Problem List   Diagnosis Date Noted  . Hx antineoplastic chemo 10/02/2017  . Preop cardiovascular exam 09/24/2017  . Abnormal EKG 09/24/2017  . Essential hypertension 09/24/2017  . Diabetes ([place]) 09/24/2017  . Breast cancer ([place]) 09/24/2017   Past Medical History:  Diagnosis Date  . Abnormal EKG 09/24/2017  . Breast cancer ([place]) 09/24/2017  . Diabetes ([place]) 09/24/2017  . Essential hypertension 09/24/2017  . Preop cardiovascular exam 09/24/2017    Past Surgical History:  Procedure Laterality Date  . BREAST LUMPECTOMY    . CHOLECYSTECTOMY    . KNEE SURGERY      Current Outpatient Medications  Medication Sig Dispense Refill Last Dose  . acetaminophen (TYLENOL) 500 MG tablet Take 500 mg by mouth 2 (two) times daily.   Taking   . CINNAMON PO Take 1 tablet by mouth daily.   Taking  . lisinopril (PRINIVIL,ZESTRIL) 5 MG tablet Take 5 mg by mouth daily.  0 Taking  . Menthol, Topical Analgesic, (BIOFREEZE EX) Apply 1 application topically 3 (three) times daily as needed (pain.).     Marland Kitchen metFORMIN (GLUCOPHAGE) 500 MG tablet Take 1,000 mg by mouth 2 (two) times daily.   1 Taking  . Multiple Vitamin (MULTIVITAMIN WITH MINERALS) TABS tablet Take 1 tablet by mouth daily.     Marland Kitchen OVER THE COUNTER MEDICATION Take 1 capsule by mouth See admin instructions. Take 1 capsule by mouth 2-3 times daily with meals. Sucontral D (B-vitamins, folic acid, vitamins C and E, as well as trace minerals zinc and chromium)      No current facility-administered medications for this visit.    Allergies  Allergen Reactions  . Penicillins Hives    Has patient had a PCN reaction causing immediate rash, facial/tongue/throat swelling, SOB or lightheadedness with hypotension: No Has patient had a PCN reaction causing severe rash involving mucus membranes or skin necrosis: No Has patient had a PCN reaction that required hospitalization: No Has patient had a PCN reaction occurring within the last 10 years: No If all of the above answers are "NO", then may proceed with Cephalosporin use.     Social History   Tobacco Use  . Smoking status: Never Smoker  . Smokeless tobacco: Never Used  Substance Use Topics  . Alcohol use: Never    Frequency: Never    Family History  Problem Relation Age of Onset  . Hypertension Mother   . Bladder Cancer Father   .  Diabetes Paternal Grandmother   . Diabetes Paternal Uncle   . Hypertension Paternal Uncle   . Heart disease Paternal Uncle   . Diabetes Paternal Aunt      Review of Systems  Constitutional: Negative.   HENT: Negative.   Eyes: Negative.   Respiratory: Negative.   Cardiovascular: Negative.   Gastrointestinal: Negative.   Genitourinary: Negative.   Musculoskeletal: Positive for joint pain.  Skin:  Negative.   Neurological: Negative.   Endo/Heme/Allergies: Negative.   Psychiatric/Behavioral: Negative.     Objective:  Physical Exam  Vitals reviewed. Constitutional: She is oriented to person, place, and time. She appears well-developed and well-nourished.  HENT:  Head: Normocephalic and atraumatic.  Eyes: Pupils are equal, round, and reactive to light. Conjunctivae and EOM are normal.  Neck: Normal range of motion. Neck supple.  Cardiovascular: Normal rate, regular rhythm and intact distal pulses.  Respiratory: Effort normal. No respiratory distress.  GI: Soft. She exhibits no distension.  Genitourinary:  Genitourinary Comments: deferred  Musculoskeletal:       Right knee: She exhibits decreased range of motion, swelling and effusion. Tenderness found. Medial joint line and lateral joint line tenderness noted.  Neurological: She is alert and oriented to person, place, and time. She has normal reflexes.  Skin: Skin is warm and dry.  Psychiatric: She has a normal mood and affect. Her behavior is normal. Judgment and thought content normal.    Vital signs in last 24 hours: @VSRANGES @  Labs:   Estimated body mass index is 38.31 kg/m as calculated from the following:   Height as of 10/02/17: 5\' 4"  (1.626 m).   Weight as of 10/02/17: 101.2 kg.   Imaging Review Plain radiographs demonstrate severe degenerative joint disease of the right knee(s). The overall alignment issignificant varus. The bone quality appears to be adequate for age and reported activity level.   Preoperative templating of the joint replacement has been completed, documented, and submitted to the Operating Room personnel in order to optimize intra-operative equipment management.   Anticipated LOS equal to or greater than 2 midnights due to - Age 46 and older with one or more of the following:  - Obesity  - Expected need for hospital services (PT, OT, Nursing) required for safe  discharge  - Anticipated  need for postoperative skilled nursing care or inpatient rehab  - Active co-morbidities: Diabetes OR   - Unanticipated findings during/Post Surgery: None  - Patient is a high risk of re-admission due to: None     Assessment/Plan:  End stage arthritis, right knee   The patient history, physical examination, clinical judgment of the provider and imaging studies are consistent with end stage degenerative joint disease of the right knee(s) and total knee arthroplasty is deemed medically necessary. The treatment options including medical management, injection therapy arthroscopy and arthroplasty were discussed at length. The risks and benefits of total knee arthroplasty were presented and reviewed. The risks due to aseptic loosening, infection, stiffness, patella tracking problems, thromboembolic complications and other imponderables were discussed. The patient acknowledged the explanation, agreed to proceed with the plan and consent was signed. Patient is being admitted for inpatient treatment for surgery, pain control, PT, OT, prophylactic antibiotics, VTE prophylaxis, progressive ambulation and ADL's and discharge planning. The patient is planning to be discharged home with outpatient PT @ [facility]. Has walker.

## 2017-11-15 ENCOUNTER — Encounter (HOSPITAL_COMMUNITY)
Admission: RE | Admit: 2017-11-15 | Discharge: 2017-11-15 | Disposition: A | Discharge status: Home / Self Care | Payer: BLUE CROSS/BLUE SHIELD | Source: Ambulatory Visit | Attending: Orthopedic Surgery | Admitting: Orthopedic Surgery

## 2017-11-15 ENCOUNTER — Encounter (HOSPITAL_COMMUNITY): Payer: Self-pay | Admitting: Emergency Medicine

## 2017-11-15 ENCOUNTER — Other Ambulatory Visit: Payer: Self-pay

## 2017-11-15 DIAGNOSIS — Z01812 Encounter for preprocedural laboratory examination: Secondary | ICD-10-CM | POA: Insufficient documentation

## 2017-11-15 HISTORY — DX: Unspecified osteoarthritis, unspecified site: M19.90

## 2017-11-15 LAB — BASIC METABOLIC PANEL
Anion gap: 10 (ref 5–15)
BUN: 11 mg/dL (ref 6–20)
CHLORIDE: 104 mmol/L (ref 98–111)
CO2: 30 mmol/L (ref 22–32)
CREATININE: 0.67 mg/dL (ref 0.44–1.00)
Calcium: 10 mg/dL (ref 8.9–10.3)
GFR calc Af Amer: >60 mL/min (ref >60)
GFR calc non Af Amer: >60 mL/min (ref >60)
GLUCOSE: 170 mg/dL — AB (ref 70–99)
POTASSIUM: 4.6 mmol/L (ref 3.5–5.1)
SODIUM: 144 mmol/L (ref 135–145)

## 2017-11-15 LAB — SURGICAL PCR SCREEN
MRSA, PCR: NEGATIVE
STAPHYLOCOCCUS AUREUS: NEGATIVE

## 2017-11-15 LAB — CBC
HEMATOCRIT: 41.2 % (ref 36.0–46.0)
HEMOGLOBIN: 13.2 g/dL (ref 12.0–15.0)
MCH: 29.8 pg (ref 26.0–34.0)
MCHC: 32 g/dL (ref 30.0–36.0)
MCV: 93 fL (ref 80.0–100.0)
Platelets: 273 10*3/uL (ref 150–400)
RBC: 4.43 MIL/uL (ref 3.87–5.11)
RDW: 12.5 % (ref 11.5–15.5)
WBC: 6.1 10*3/uL (ref 4.0–10.5)
nRBC: 0 % (ref 0.0–0.2)

## 2017-11-15 LAB — GLUCOSE, CAPILLARY: Glucose-Capillary: 148 mg/dL — ABNORMAL HIGH (ref 70–99)

## 2017-11-15 LAB — HEMOGLOBIN A1C
HEMOGLOBIN A1C: 6.8 % — AB (ref 4.8–5.6)
Mean Plasma Glucose: 148.46 mg/dL

## 2017-11-15 LAB — ABO/RH: ABO/RH(D): O POS

## 2017-11-15 NOTE — Progress Notes (Signed)
   11/15/17 0947  OBSTRUCTIVE SLEEP APNEA  Have you ever been diagnosed with sleep apnea through a sleep study? No  Do you snore loudly (loud enough to be heard through closed doors)?  1  Do you often feel tired, fatigued, or sleepy during the daytime (such as falling asleep during driving or talking to someone)? 0  Has anyone observed you stop breathing during your sleep? 0  Do you have, or are you being treated for high blood pressure? 1  BMI more than 35 kg/m2? 1  Age > 50 (1-yes) 1  Neck circumference greater than:Female 16 inches or larger, Female 17inches or larger? 1  Female Gender (Yes=1) 0  Obstructive Sleep Apnea Score 5

## 2017-11-21 NOTE — Anesthesia Preprocedure Evaluation (Addendum)
Anesthesia Evaluation  Patient identified by MRN, date of birth, ID band Patient awake    Reviewed: Allergy & Precautions, H&P , NPO status , Patient's Chart, lab work & pertinent test results  Airway Mallampati: II  TM Distance: >3 FB Neck ROM: Full    Dental no notable dental hx. (+) Dental Advisory Given   Pulmonary neg pulmonary ROS,    Pulmonary exam normal breath sounds clear to auscultation       Cardiovascular hypertension, Pt. on medications Normal cardiovascular exam Rhythm:Regular Rate:Normal  Echo 10/11/2017 Study Conclusions  - Left ventricle: The cavity size was normal. Wall thickness was   normal. Systolic function was vigorous. The estimated ejection   fraction was in the range of 65% to 70%. Wall motion was normal;   there were no regional wall motion abnormalities. Doppler   parameters are consistent with abnormal left ventricular   relaxation (grade 1 diastolic dysfunction).  Impressions:  - 1. Left ventricular systolic function is preserved and appears   vigorous. Visually estimated at 65 to 70%. Impaired left   ventricular relaxation. The study is otherwise unremarkable   Neuro/Psych negative neurological ROS  negative psych ROS   GI/Hepatic negative GI ROS, Neg liver ROS,   Endo/Other  diabetes, Type 2  Renal/GU negative Renal ROS     Musculoskeletal  (+) Arthritis ,   Abdominal (+) + obese,   Peds  Hematology negative hematology ROS (+)   Anesthesia Other Findings   Reproductive/Obstetrics                            Lab Results  Component Value Date   CREATININE 0.67 11/15/2017   BUN 11 11/15/2017   NA 144 11/15/2017   K 4.6 11/15/2017   CL 104 11/15/2017   CO2 30 11/15/2017    Lab Results  Component Value Date   WBC 6.1 11/15/2017   HGB 13.2 11/15/2017   HCT 41.2 11/15/2017   MCV 93.0 11/15/2017   PLT 273 11/15/2017    Anesthesia  Physical Anesthesia Plan  ASA: III  Anesthesia Plan: Spinal   Post-op Pain Management:  Regional for Post-op pain   Induction:   PONV Risk Score and Plan: 2 and Treatment may vary due to age or medical condition, Ondansetron and Dexamethasone  Airway Management Planned: Natural Airway and Nasal Cannula  Additional Equipment:   Intra-op Plan:   Post-operative Plan:   Informed Consent: I have reviewed the patients History and Physical, chart, labs and discussed the procedure including the risks, benefits and alternatives for the proposed anesthesia with the patient or authorized representative who has indicated his/her understanding and acceptance.   Dental advisory given  Plan Discussed with:   Anesthesia Plan Comments: (R Knee)       Anesthesia Quick Evaluation

## 2017-11-22 ENCOUNTER — Encounter (HOSPITAL_COMMUNITY)
Admission: RE | Disposition: A | Discharge status: Home / Self Care | Payer: Self-pay | Source: Ambulatory Visit | Attending: Orthopedic Surgery

## 2017-11-22 ENCOUNTER — Inpatient Hospital Stay (HOSPITAL_COMMUNITY)
Admission: RE | Admit: 2017-11-22 | Discharge: 2017-11-23 | DRG: 470 | Disposition: A | Discharge status: Home / Self Care | Payer: BLUE CROSS/BLUE SHIELD | Source: Ambulatory Visit | Attending: Orthopedic Surgery | Admitting: Orthopedic Surgery

## 2017-11-22 ENCOUNTER — Other Ambulatory Visit: Payer: Self-pay

## 2017-11-22 ENCOUNTER — Inpatient Hospital Stay (HOSPITAL_COMMUNITY): Payer: BLUE CROSS/BLUE SHIELD | Admitting: Anesthesiology

## 2017-11-22 ENCOUNTER — Inpatient Hospital Stay (HOSPITAL_COMMUNITY): Payer: BLUE CROSS/BLUE SHIELD

## 2017-11-22 ENCOUNTER — Encounter (HOSPITAL_COMMUNITY): Payer: Self-pay | Admitting: *Deleted

## 2017-11-22 DIAGNOSIS — Z8052 Family history of malignant neoplasm of bladder: Secondary | ICD-10-CM

## 2017-11-22 DIAGNOSIS — M1711 Unilateral primary osteoarthritis, right knee: Secondary | ICD-10-CM | POA: Diagnosis not present

## 2017-11-22 DIAGNOSIS — I10 Essential (primary) hypertension: Secondary | ICD-10-CM | POA: Diagnosis present

## 2017-11-22 DIAGNOSIS — Z8249 Family history of ischemic heart disease and other diseases of the circulatory system: Secondary | ICD-10-CM | POA: Diagnosis not present

## 2017-11-22 DIAGNOSIS — E119 Type 2 diabetes mellitus without complications: Secondary | ICD-10-CM | POA: Diagnosis not present

## 2017-11-22 DIAGNOSIS — Z880 Allergy status to penicillin: Secondary | ICD-10-CM

## 2017-11-22 DIAGNOSIS — Z96651 Presence of right artificial knee joint: Secondary | ICD-10-CM

## 2017-11-22 DIAGNOSIS — Z833 Family history of diabetes mellitus: Secondary | ICD-10-CM

## 2017-11-22 DIAGNOSIS — Z471 Aftercare following joint replacement surgery: Secondary | ICD-10-CM | POA: Diagnosis not present

## 2017-11-22 DIAGNOSIS — Z853 Personal history of malignant neoplasm of breast: Secondary | ICD-10-CM

## 2017-11-22 DIAGNOSIS — G8918 Other acute postprocedural pain: Secondary | ICD-10-CM | POA: Diagnosis not present

## 2017-11-22 DIAGNOSIS — Z9221 Personal history of antineoplastic chemotherapy: Secondary | ICD-10-CM | POA: Diagnosis not present

## 2017-11-22 DIAGNOSIS — Z923 Personal history of irradiation: Secondary | ICD-10-CM

## 2017-11-22 HISTORY — PX: KNEE ARTHROPLASTY: SHX992

## 2017-11-22 LAB — GLUCOSE, CAPILLARY
GLUCOSE-CAPILLARY: 161 mg/dL — AB (ref 70–99)
GLUCOSE-CAPILLARY: 257 mg/dL — AB (ref 70–99)
Glucose-Capillary: 144 mg/dL — ABNORMAL HIGH (ref 70–99)
Glucose-Capillary: 178 mg/dL — ABNORMAL HIGH (ref 70–99)

## 2017-11-22 LAB — TYPE AND SCREEN
ABO/RH(D): O POS
ANTIBODY SCREEN: NEGATIVE

## 2017-11-22 SURGERY — ARTHROPLASTY, KNEE, TOTAL, USING IMAGELESS COMPUTER-ASSISTED NAVIGATION
Anesthesia: Spinal | Site: Knee | Laterality: Right

## 2017-11-22 MED ORDER — MIDAZOLAM HCL 2 MG/2ML IJ SOLN
INTRAMUSCULAR | Status: AC
  Administered 2017-11-22: 2 mg
  Filled 2017-11-22: qty 2

## 2017-11-22 MED ORDER — CHLORHEXIDINE GLUCONATE 4 % EX LIQD: 60.0000 mL | Freq: Once | CUTANEOUS | Status: DC

## 2017-11-22 MED ORDER — KETOROLAC TROMETHAMINE 30 MG/ML IJ SOLN
INTRAMUSCULAR | Status: AC
  Filled 2017-11-22: qty 1

## 2017-11-22 MED ORDER — BUPIVACAINE HCL (PF) 0.25 % IJ SOLN
INTRAMUSCULAR | Status: DC | PRN
  Administered 2017-11-22: 30 mL

## 2017-11-22 MED ORDER — SODIUM CHLORIDE 0.9 % IV SOLN
INTRAVENOUS | Status: DC | PRN
  Administered 2017-11-22: 25 ug/min via INTRAVENOUS

## 2017-11-22 MED ORDER — STERILE WATER FOR IRRIGATION IR SOLN
Status: DC | PRN
  Administered 2017-11-22: 1000 mL

## 2017-11-22 MED ORDER — PROPOFOL 500 MG/50ML IV EMUL
INTRAVENOUS | Status: DC | PRN
  Administered 2017-11-22: 75 ug/kg/min via INTRAVENOUS

## 2017-11-22 MED ORDER — ISOPROPYL ALCOHOL 70 % SOLN
Status: AC
  Filled 2017-11-22: qty 480

## 2017-11-22 MED ORDER — ONDANSETRON HCL 4 MG/2ML IJ SOLN: 4.0000 mg | Freq: Four times a day (QID) | INTRAMUSCULAR | Status: DC | PRN

## 2017-11-22 MED ORDER — SODIUM CHLORIDE 0.9 % IJ SOLN
INTRAMUSCULAR | Status: DC | PRN
  Administered 2017-11-22: 30 mL

## 2017-11-22 MED ORDER — LISINOPRIL 5 MG PO TABS
5.0000 mg | ORAL_TABLET | Freq: Every day | ORAL | Status: DC
  Administered 2017-11-23: 5 mg via ORAL
  Filled 2017-11-22: qty 1

## 2017-11-22 MED ORDER — GLYCOPYRROLATE PF 0.2 MG/ML IJ SOSY
PREFILLED_SYRINGE | INTRAMUSCULAR | Status: AC
  Filled 2017-11-22: qty 1

## 2017-11-22 MED ORDER — SODIUM CHLORIDE 0.9 % IR SOLN
Status: DC | PRN
  Administered 2017-11-22: 1000 mL

## 2017-11-22 MED ORDER — KETOROLAC TROMETHAMINE 30 MG/ML IJ SOLN
INTRAMUSCULAR | Status: DC | PRN
  Administered 2017-11-22: 30 mg

## 2017-11-22 MED ORDER — DIPHENHYDRAMINE HCL 12.5 MG/5ML PO ELIX: 12.5000 mg | ORAL_SOLUTION | ORAL | Status: DC | PRN

## 2017-11-22 MED ORDER — 0.9 % SODIUM CHLORIDE (POUR BTL) OPTIME
TOPICAL | Status: DC | PRN
  Administered 2017-11-22: 1000 mL

## 2017-11-22 MED ORDER — BUPIVACAINE HCL (PF) 0.25 % IJ SOLN
INTRAMUSCULAR | Status: AC
  Filled 2017-11-22: qty 30

## 2017-11-22 MED ORDER — ACETAMINOPHEN 325 MG PO TABS: 325.0000 mg | ORAL_TABLET | Freq: Four times a day (QID) | ORAL | Status: DC | PRN

## 2017-11-22 MED ORDER — DOCUSATE SODIUM 100 MG PO CAPS
100.0000 mg | ORAL_CAPSULE | Freq: Two times a day (BID) | ORAL | Status: DC
  Administered 2017-11-22 – 2017-11-23 (×2): 100 mg via ORAL
  Filled 2017-11-22 (×2): qty 1

## 2017-11-22 MED ORDER — FENTANYL CITRATE (PF) 100 MCG/2ML IJ SOLN
INTRAMUSCULAR | Status: AC
  Administered 2017-11-22: 100 ug
  Filled 2017-11-22: qty 2

## 2017-11-22 MED ORDER — BUPIVACAINE IN DEXTROSE 0.75-8.25 % IT SOLN
INTRATHECAL | Status: DC | PRN
  Administered 2017-11-22: 2 mL via INTRATHECAL

## 2017-11-22 MED ORDER — LACTATED RINGERS IV SOLN
INTRAVENOUS | Status: DC
  Administered 2017-11-22 (×2): via INTRAVENOUS

## 2017-11-22 MED ORDER — POLYETHYLENE GLYCOL 3350 17 G PO PACK: 17.0000 g | PACK | Freq: Every day | ORAL | Status: DC | PRN

## 2017-11-22 MED ORDER — SODIUM CHLORIDE 0.9 % IJ SOLN
INTRAMUSCULAR | Status: AC
  Filled 2017-11-22: qty 50

## 2017-11-22 MED ORDER — METOCLOPRAMIDE HCL 5 MG/ML IJ SOLN: 5.0000 mg | Freq: Three times a day (TID) | INTRAMUSCULAR | Status: DC | PRN

## 2017-11-22 MED ORDER — ACETAMINOPHEN 10 MG/ML IV SOLN: 1000.0000 mg | Freq: Once | INTRAVENOUS | Status: DC | PRN

## 2017-11-22 MED ORDER — MENTHOL 3 MG MT LOZG: 1.0000 | LOZENGE | OROMUCOSAL | Status: DC | PRN

## 2017-11-22 MED ORDER — KETOROLAC TROMETHAMINE 15 MG/ML IJ SOLN
7.5000 mg | Freq: Four times a day (QID) | INTRAMUSCULAR | Status: AC
  Administered 2017-11-22 – 2017-11-23 (×4): 7.5 mg via INTRAVENOUS
  Filled 2017-11-22 (×4): qty 1

## 2017-11-22 MED ORDER — ONDANSETRON HCL 4 MG PO TABS
4.0000 mg | ORAL_TABLET | Freq: Four times a day (QID) | ORAL | Status: DC | PRN
  Administered 2017-11-23: 4 mg via ORAL

## 2017-11-22 MED ORDER — ONDANSETRON HCL 4 MG/2ML IJ SOLN
INTRAMUSCULAR | Status: DC | PRN
  Administered 2017-11-22: 4 mg via INTRAVENOUS

## 2017-11-22 MED ORDER — ACETAMINOPHEN 10 MG/ML IV SOLN
1000.0000 mg | INTRAVENOUS | Status: AC
  Administered 2017-11-22: 1000 mg via INTRAVENOUS
  Filled 2017-11-22: qty 100

## 2017-11-22 MED ORDER — HYDROMORPHONE HCL 1 MG/ML IJ SOLN: 0.2500 mg | INTRAMUSCULAR | Status: DC | PRN

## 2017-11-22 MED ORDER — CLINDAMYCIN PHOSPHATE 600 MG/50ML IV SOLN
600.0000 mg | Freq: Four times a day (QID) | INTRAVENOUS | Status: AC
  Administered 2017-11-22 (×2): 600 mg via INTRAVENOUS
  Filled 2017-11-22 (×2): qty 50

## 2017-11-22 MED ORDER — CLINDAMYCIN PHOSPHATE 900 MG/50ML IV SOLN
INTRAVENOUS | Status: DC | PRN
  Administered 2017-11-22: 900 mg via INTRAVENOUS

## 2017-11-22 MED ORDER — SODIUM CHLORIDE 0.9 % IV SOLN: INTRAVENOUS | Status: DC

## 2017-11-22 MED ORDER — ASPIRIN 81 MG PO CHEW
81.0000 mg | CHEWABLE_TABLET | Freq: Two times a day (BID) | ORAL | Status: DC
  Administered 2017-11-22 – 2017-11-23 (×2): 81 mg via ORAL
  Filled 2017-11-22 (×2): qty 1

## 2017-11-22 MED ORDER — PROPOFOL 10 MG/ML IV BOLUS
INTRAVENOUS | Status: AC
  Filled 2017-11-22: qty 20

## 2017-11-22 MED ORDER — HYDROCODONE-ACETAMINOPHEN 7.5-325 MG PO TABS: 1.0000 | ORAL_TABLET | Freq: Once | ORAL | Status: DC | PRN

## 2017-11-22 MED ORDER — PHENOL 1.4 % MT LIQD
1.0000 | OROMUCOSAL | Status: DC | PRN
  Filled 2017-11-22: qty 177

## 2017-11-22 MED ORDER — SODIUM CHLORIDE 0.9 % IV SOLN
INTRAVENOUS | Status: DC
  Administered 2017-11-22 (×2): via INTRAVENOUS

## 2017-11-22 MED ORDER — PROMETHAZINE HCL 25 MG/ML IJ SOLN: 6.2500 mg | INTRAMUSCULAR | Status: DC | PRN

## 2017-11-22 MED ORDER — EPHEDRINE 5 MG/ML INJ
INTRAVENOUS | Status: AC
  Filled 2017-11-22: qty 10

## 2017-11-22 MED ORDER — METOCLOPRAMIDE HCL 5 MG PO TABS: 5.0000 mg | ORAL_TABLET | Freq: Three times a day (TID) | ORAL | Status: DC | PRN

## 2017-11-22 MED ORDER — MEPERIDINE HCL 50 MG/ML IJ SOLN: 6.2500 mg | INTRAMUSCULAR | Status: DC | PRN

## 2017-11-22 MED ORDER — METFORMIN HCL 500 MG PO TABS
1000.0000 mg | ORAL_TABLET | Freq: Two times a day (BID) | ORAL | Status: DC
  Administered 2017-11-23: 1000 mg via ORAL
  Filled 2017-11-22: qty 2

## 2017-11-22 MED ORDER — PROPOFOL 10 MG/ML IV BOLUS
INTRAVENOUS | Status: DC | PRN
  Administered 2017-11-22: 20 mg via INTRAVENOUS

## 2017-11-22 MED ORDER — METHOCARBAMOL 500 MG PO TABS
500.0000 mg | ORAL_TABLET | Freq: Four times a day (QID) | ORAL | Status: DC | PRN
  Administered 2017-11-22: 500 mg via ORAL
  Filled 2017-11-22: qty 1

## 2017-11-22 MED ORDER — TRANEXAMIC ACID-NACL 1000-0.7 MG/100ML-% IV SOLN
1000.0000 mg | INTRAVENOUS | Status: DC
  Filled 2017-11-22: qty 100

## 2017-11-22 MED ORDER — SENNA 8.6 MG PO TABS
1.0000 | ORAL_TABLET | Freq: Two times a day (BID) | ORAL | Status: DC
  Administered 2017-11-22 – 2017-11-23 (×2): 8.6 mg via ORAL
  Filled 2017-11-22 (×2): qty 1

## 2017-11-22 MED ORDER — ALUM & MAG HYDROXIDE-SIMETH 200-200-20 MG/5ML PO SUSP: 30.0000 mL | ORAL | Status: DC | PRN

## 2017-11-22 MED ORDER — DEXAMETHASONE SODIUM PHOSPHATE 10 MG/ML IJ SOLN
INTRAMUSCULAR | Status: DC | PRN
  Administered 2017-11-22: 6 mg via INTRAVENOUS

## 2017-11-22 MED ORDER — PROPOFOL 10 MG/ML IV BOLUS
INTRAVENOUS | Status: AC
  Filled 2017-11-22: qty 60

## 2017-11-22 MED ORDER — TRANEXAMIC ACID 1000 MG/10ML IV SOLN
INTRAVENOUS | Status: DC | PRN
  Administered 2017-11-22: 1000 mg via INTRAVENOUS

## 2017-11-22 MED ORDER — GLYCOPYRROLATE PF 0.2 MG/ML IJ SOSY
PREFILLED_SYRINGE | INTRAMUSCULAR | Status: DC | PRN
  Administered 2017-11-22: .2 mg via INTRAVENOUS

## 2017-11-22 MED ORDER — CLINDAMYCIN PHOSPHATE 900 MG/50ML IV SOLN
900.0000 mg | INTRAVENOUS | Status: DC
  Filled 2017-11-22: qty 50

## 2017-11-22 MED ORDER — HYDROCODONE-ACETAMINOPHEN 5-325 MG PO TABS
1.0000 | ORAL_TABLET | ORAL | Status: DC | PRN
  Administered 2017-11-22: 1 via ORAL
  Filled 2017-11-22: qty 1

## 2017-11-22 MED ORDER — SODIUM CHLORIDE 0.9 % IR SOLN
Status: DC | PRN
  Administered 2017-11-22: 3000 mL

## 2017-11-22 MED ORDER — ROPIVACAINE HCL 5 MG/ML IJ SOLN
INTRAMUSCULAR | Status: DC | PRN
  Administered 2017-11-22: 30 mL via PERINEURAL

## 2017-11-22 MED ORDER — METHOCARBAMOL 500 MG IVPB - SIMPLE MED
500.0000 mg | Freq: Four times a day (QID) | INTRAVENOUS | Status: DC | PRN
  Filled 2017-11-22: qty 50

## 2017-11-22 MED ORDER — INSULIN ASPART 100 UNIT/ML ~~LOC~~ SOLN
0.0000 [IU] | Freq: Three times a day (TID) | SUBCUTANEOUS | Status: DC
  Administered 2017-11-22: 3 [IU] via SUBCUTANEOUS
  Administered 2017-11-23: 2 [IU] via SUBCUTANEOUS
  Administered 2017-11-23: 5 [IU] via SUBCUTANEOUS

## 2017-11-22 MED ORDER — POVIDONE-IODINE 10 % EX SWAB
2.0000 "application " | Freq: Once | CUTANEOUS | Status: AC
  Administered 2017-11-22: 2 via TOPICAL

## 2017-11-22 MED ORDER — ISOPROPYL ALCOHOL 70 % SOLN
Status: DC | PRN
  Administered 2017-11-22: 1 via TOPICAL

## 2017-11-22 MED ORDER — HYDROCODONE-ACETAMINOPHEN 7.5-325 MG PO TABS
1.0000 | ORAL_TABLET | ORAL | Status: DC | PRN
  Administered 2017-11-23 (×3): 2 via ORAL
  Filled 2017-11-22 (×3): qty 2

## 2017-11-22 MED ORDER — DEXAMETHASONE SODIUM PHOSPHATE 10 MG/ML IJ SOLN
10.0000 mg | Freq: Once | INTRAMUSCULAR | Status: AC
  Administered 2017-11-23: 10 mg via INTRAVENOUS
  Filled 2017-11-22: qty 1

## 2017-11-22 MED ORDER — MORPHINE SULFATE (PF) 2 MG/ML IV SOLN
0.5000 mg | INTRAVENOUS | Status: DC | PRN
  Administered 2017-11-23: 1 mg via INTRAVENOUS
  Filled 2017-11-22: qty 1

## 2017-11-22 SURGICAL SUPPLY — 63 items
BAG ZIPLOCK 12X15 (MISCELLANEOUS) IMPLANT
BANDAGE ACE 4X5 VEL STRL LF (GAUZE/BANDAGES/DRESSINGS) ×2 IMPLANT
BANDAGE ACE 6X5 VEL STRL LF (GAUZE/BANDAGES/DRESSINGS) ×2 IMPLANT
BANDAGE ELASTIC 4 VELCRO ST LF (GAUZE/BANDAGES/DRESSINGS) ×2 IMPLANT
BANDAGE ELASTIC 6 VELCRO ST LF (GAUZE/BANDAGES/DRESSINGS) ×2 IMPLANT
BATTERY INSTRU NAVIGATION (MISCELLANEOUS) ×6 IMPLANT
BEARING TIBIA INSRT KNEE SZ4 9 (Insert) ×1 IMPLANT
BLADE SAW RECIPROCATING 77.5 (BLADE) ×2 IMPLANT
CHLORAPREP W/TINT 26ML (MISCELLANEOUS) ×4 IMPLANT
COVER SURGICAL LIGHT HANDLE (MISCELLANEOUS) ×2 IMPLANT
COVER WAND RF STERILE (DRAPES) ×2 IMPLANT
CUFF TOURN SGL QUICK 34 (TOURNIQUET CUFF) ×1
CUFF TRNQT CYL 34X4X40X1 (TOURNIQUET CUFF) ×1 IMPLANT
DECANTER SPIKE VIAL GLASS SM (MISCELLANEOUS) ×4 IMPLANT
DERMABOND ADVANCED (GAUZE/BANDAGES/DRESSINGS) ×1
DERMABOND ADVANCED .7 DNX12 (GAUZE/BANDAGES/DRESSINGS) ×1 IMPLANT
DRAPE SHEET LG 3/4 BI-LAMINATE (DRAPES) ×4 IMPLANT
DRAPE U-SHAPE 47X51 STRL (DRAPES) ×2 IMPLANT
DRSG AQUACEL AG ADV 3.5X10 (GAUZE/BANDAGES/DRESSINGS) ×2 IMPLANT
DRSG TEGADERM 4X4.75 (GAUZE/BANDAGES/DRESSINGS) IMPLANT
ELECT BLADE TIP CTD 4 INCH (ELECTRODE) ×2 IMPLANT
ELECT REM PT RETURN 15FT ADLT (MISCELLANEOUS) ×2 IMPLANT
EVACUATOR 1/8 PVC DRAIN (DRAIN) IMPLANT
GAUZE SPONGE 4X4 12PLY STRL (GAUZE/BANDAGES/DRESSINGS) ×2 IMPLANT
GLOVE BIO SURGEON STRL SZ8.5 (GLOVE) ×4 IMPLANT
GLOVE BIOGEL PI IND STRL 8.5 (GLOVE) ×1 IMPLANT
GLOVE BIOGEL PI INDICATOR 8.5 (GLOVE) ×1
GOWN SPEC L3 XXLG W/TWL (GOWN DISPOSABLE) ×2 IMPLANT
HANDPIECE INTERPULSE COAX TIP (DISPOSABLE) ×1
HOOD PEEL AWAY FLYTE STAYCOOL (MISCELLANEOUS) ×6 IMPLANT
KNEE FEMORAL COMP RT RETAIN (Knees) ×2 IMPLANT
KNEE PATELLA ASYMMETRIC 10X32 (Knees) ×2 IMPLANT
KNEE TIBIAL COMP TRI SZ4 (Knees) ×2 IMPLANT
MARKER SKIN DUAL TIP RULER LAB (MISCELLANEOUS) ×2 IMPLANT
NEEDLE SPNL 18GX3.5 QUINCKE PK (NEEDLE) ×2 IMPLANT
NS IRRIG 1000ML POUR BTL (IV SOLUTION) ×2 IMPLANT
PACK TOTAL KNEE CUSTOM (KITS) ×2 IMPLANT
PADDING CAST COTTON 6X4 STRL (CAST SUPPLIES) ×2 IMPLANT
POSITIONER SURGICAL ARM (MISCELLANEOUS) ×2 IMPLANT
SAW OSC TIP CART 19.5X105X1.3 (SAW) ×2 IMPLANT
SEALER BIPOLAR AQUA 6.0 (INSTRUMENTS) ×2 IMPLANT
SET HNDPC FAN SPRY TIP SCT (DISPOSABLE) ×1 IMPLANT
SET PAD KNEE POSITIONER (MISCELLANEOUS) ×2 IMPLANT
SPONGE DRAIN TRACH 4X4 STRL 2S (GAUZE/BANDAGES/DRESSINGS) IMPLANT
SPONGE LAP 18X18 RF (DISPOSABLE) IMPLANT
SUT MNCRL AB 3-0 PS2 18 (SUTURE) ×2 IMPLANT
SUT MNCRL AB 4-0 PS2 18 (SUTURE) ×2 IMPLANT
SUT MON AB 2-0 CT1 36 (SUTURE) ×4 IMPLANT
SUT STRATAFIX PDO 1 14 VIOLET (SUTURE) ×1
SUT STRATFX PDO 1 14 VIOLET (SUTURE) ×1
SUT VIC AB 1 CTX 36 (SUTURE) ×2
SUT VIC AB 1 CTX36XBRD ANBCTR (SUTURE) ×2 IMPLANT
SUT VIC AB 2-0 CT1 27 (SUTURE) ×2
SUT VIC AB 2-0 CT1 TAPERPNT 27 (SUTURE) ×2 IMPLANT
SUTURE STRATFX PDO 1 14 VIOLET (SUTURE) ×1 IMPLANT
SYR 50ML LL SCALE MARK (SYRINGE) ×2 IMPLANT
TIBIA BEAR INSERT KNEE SZ4 9 (Insert) ×2 IMPLANT
TOWER CARTRIDGE SMART MIX (DISPOSABLE) IMPLANT
TRAY FOLEY BAG SILVER LF 14FR (CATHETERS) ×2 IMPLANT
TRAY FOLEY MTR SLVR 16FR STAT (SET/KITS/TRAYS/PACK) IMPLANT
WATER STERILE IRR 1000ML POUR (IV SOLUTION) ×4 IMPLANT
WRAP KNEE MAXI GEL POST OP (GAUZE/BANDAGES/DRESSINGS) ×2 IMPLANT
YANKAUER SUCT BULB TIP 10FT TU (MISCELLANEOUS) ×2 IMPLANT

## 2017-11-22 NOTE — Discharge Instructions (Signed)
° °Dr. Cecilia Nishikawa °Total Joint Specialist °[facility] °[address] °[place], [place] [address] °[phone] ° °TOTAL KNEE REPLACEMENT POSTOPERATIVE DIRECTIONS ° ° ° °Knee Rehabilitation, Guidelines Following Surgery  °Results after knee surgery are often greatly improved when you follow the exercise, range of motion and muscle strengthening exercises prescribed by your doctor. Safety measures are also important to protect the knee from further injury. Any time any of these exercises cause you to have increased pain or swelling in your knee joint, decrease the amount until you are comfortable again and slowly increase them. If you have problems or questions, call your caregiver or physical therapist for advice.  ° °WEIGHT BEARING °Weight bearing as tolerated with assist device (walker, cane, etc) as directed, use it as long as suggested by your surgeon or therapist, typically at least 4-6 weeks. ° °HOME CARE INSTRUCTIONS  °Remove items at home which could result in a fall. This includes throw rugs or furniture in walking pathways.  °Continue medications as instructed at time of discharge. °You may have some home medications which will be placed on hold until you complete the course of blood thinner medication.  °You may start showering once you are discharged home but do not submerge the incision under water. Just pat the incision dry and apply a dry gauze dressing on daily. °Walk with walker as instructed.  °You may resume a sexual relationship in one month or when given the OK by your doctor.  °· Use walker as long as suggested by your caregivers. °· Avoid periods of inactivity such as sitting longer than an hour when not asleep. This helps prevent blood clots.  °You may put full weight on your legs and walk as much as is comfortable.  °You may return to work once you are cleared by your doctor.  °Do not drive a car for 6 weeks or until released by you surgeon.  °· Do not drive  while taking narcotics.  °Wear the elastic stockings for three weeks following surgery during the day but you may remove then at night. °Make sure you keep all of your appointments after your operation with all of your doctors and caregivers. You should call the office at the above phone number and make an appointment for approximately two weeks after the date of your surgery. °Do not remove your surgical dressing. The dressing is waterproof; you may take showers in 3 days, but do not take tub baths or submerge the dressing. °Please pick up a stool softener and laxative for home use as long as you are requiring pain medications. °· ICE to the affected knee every three hours for 30 minutes at a time and then as needed for pain and swelling.  Continue to use ice on the knee for pain and swelling from surgery. You may notice swelling that will progress down to the foot and ankle.  This is normal after surgery.  Elevate the leg when you are not up walking on it.   °It is important for you to complete the blood thinner medication as prescribed by your doctor. °· Continue to use the breathing machine which will help keep your temperature down.  It is common for your temperature to cycle up and down following surgery, especially at night when you are not up moving around and exerting yourself.  The breathing machine keeps your lungs expanded and your temperature down. ° °RANGE OF MOTION AND STRENGTHENING EXERCISES  °Rehabilitation of the knee is important following   a knee injury or an operation. After just a few days of immobilization, the muscles of the thigh which control the knee become weakened and shrink (atrophy). Knee exercises are designed to build up the tone and strength of the thigh muscles and to improve knee motion. Often times heat used for twenty to thirty minutes before working out will loosen up your tissues and help with improving the range of motion but do not use heat for the first two weeks following  surgery. These exercises can be done on a training (exercise) mat, on the floor, on a table or on a bed. Use what ever works the best and is most comfortable for you Knee exercises include:  °Leg Lifts - While your knee is still immobilized in a splint or cast, you can do straight leg raises. Lift the leg to 60 degrees, hold for 3 sec, and slowly lower the leg. Repeat 10-20 times 2-3 times daily. Perform this exercise against resistance later as your knee gets better.  °Quad and Hamstring Sets - Tighten up the muscle on the front of the thigh (Quad) and hold for 5-10 sec. Repeat this 10-20 times hourly. Hamstring sets are done by pushing the foot backward against an object and holding for 5-10 sec. Repeat as with quad sets.  °A rehabilitation program following serious knee injuries can speed recovery and prevent re-injury in the future due to weakened muscles. Contact your doctor or a physical therapist for more information on knee rehabilitation.  ° °SKILLED REHAB INSTRUCTIONS: °If the patient is transferred to a skilled rehab facility following release from the hospital, a list of the current medications will be sent to the facility for the patient to continue.  When discharged from the skilled rehab facility, please have the facility set up the patient's Home Health Physical Therapy prior to being released. Also, the skilled facility will be responsible for providing the patient with their medications at time of release from the facility to include their pain medication, the muscle relaxants, and their blood thinner medication. If the patient is still at the rehab facility at time of the two week follow up appointment, the skilled rehab facility will also need to assist the patient in arranging follow up appointment in our office and any transportation needs. ° °MAKE SURE YOU:  °Understand these instructions.  °Will watch your condition.  °Will get help right away if you are not doing well or get worse.   ° ° °Pick up stool softner and laxative for home use following surgery while on pain medications. °Do NOT remove your dressing. You may shower.  °Do not take tub baths or submerge incision under water. °May shower starting three days after surgery. °Please use a clean towel to pat the incision dry following showers. °Continue to use ice for pain and swelling after surgery. °Do not use any lotions or creams on the incision until instructed by your surgeon. ° °

## 2017-11-22 NOTE — Anesthesia Procedure Notes (Signed)
Spinal  Patient location during procedure: OR Start time: 11/22/2017 11:44 AM End time: 11/22/2017 11:50 AM Staffing Resident/CRNA: Lavina Hamman, CRNA Performed: resident/CRNA  Preanesthetic Checklist Completed: patient identified, site marked, surgical consent, pre-op evaluation, timeout performed, IV checked, risks and benefits discussed and monitors and equipment checked Spinal Block Patient position: sitting Prep: DuraPrep Patient monitoring: heart rate, cardiac monitor, continuous pulse ox and blood pressure Approach: midline Location: L4-5 Injection technique: single-shot Needle Needle type: Pencan  Needle gauge: 24 G Needle length: 9 cm Needle insertion depth: 7 cm Assessment Sensory level: T6 Additional Notes Lot and exp date OK.  Pt tolerated well.  VSS Negative heme and para. Clear CSF.

## 2017-11-22 NOTE — Op Note (Signed)
OPERATIVE REPORT  SURGEON: Rod Can, MD   ASSISTANT: Nehemiah Massed, PA-C. Sherlean Foot, RNFA.  PREOPERATIVE DIAGNOSIS: Right knee arthritis.   POSTOPERATIVE DIAGNOSIS: Right knee arthritis.   PROCEDURE: Right total knee arthroplasty.   IMPLANTS: Stryker Triathlon CR femur, size 4. Stryker Tritanium tibia, size 4. X3 polyethelyene insert, size 9 mm, CR. 3 button asymmetric patella, size 32 mm.  ANESTHESIA:  MAC, Regional and Spinal  TOURNIQUET TIME: Not utilized.   ESTIMATED BLOOD LOSS:-150 mL    ANTIBIOTICS: 900 mg clindamycin.  DRAINS: None.  COMPLICATIONS: None   CONDITION: PACU - hemodynamically stable.   BRIEF CLINICAL NOTE: Samantha Potter is a 57 y.o. female with a long-standing history of Right knee arthritis. After failing conservative management, the patient was indicated for total knee arthroplasty. The risks, benefits, and alternatives to the procedure were explained, and the patient elected to proceed.  PROCEDURE IN DETAIL: Adductor canal block was obtained in the pre-op holding area. Once inside the operative room, spinal anesthesia was obtained, and a foley catheter was inserted. The patient was then positioned, a nonsterile tourniquet was placed, and the lower extremity was prepped and draped in the normal sterile surgical fashion. A time-out was called verifying side and site of surgery. The patient received IV antibiotics within 60 minutes of beginning the procedure. The tourniquet was not utilized.  An anterior approach to the knee was performed utilizing a midvastus arthrotomy. A medial release was performed and the patellar fat pad was excised. Stryker navigation was used to cut the distal femur perpendicular to the mechanical axis. A freehand patellar resection was performed, and the patella was sized an prepared with 3 lug holes.  Nagivation was  used to make a neutral proximal tibia resection, taking 8 mm of bone from the less affected lateral side with 3 degrees of slope. The menisci were excised. A spacer block was placed, and the alignment and balance in extension were confirmed.   The distal femur was sized using the 3-degree external rotation guide referencing the posterior femoral cortex. The appropriate 4-in-1 cutting block was pinned into place. Rotation was checked using Whiteside's line, the epicondylar axis, and then confirmed with a spacer block in flexion. The remaining femoral cuts were performed, taking care to protect the MCL.  The tibia was sized and the trial tray was pinned into place. The remaining trail components were inserted. The knee was stable to varus and valgus stress through a full range of motion. The patella tracked centrally, and the PCL was well balanced. The trial components were removed, and the proximal tibial surface was prepared. Final components were impacted into place. The knee was tested for a final time and found to be well balanced.  The wound was copiously irrigated with normal saline with pulse lavage. Marcaine solution was injected into the periarticular soft tissue. The wound was closed in layers using #1 Vicryl and Stratafix for the fascia, 2-0 Vicryl for the subcutaneous fat, 2-0 Monocryl for the deep dermal layer, 3-0 running Monocryl subcuticular Stitch, and Dermabond for the skin. Once the glue was fully dried, an Aquacell Ag and compressive dressing were applied. Tthe patient was transported to the recovery room in stable condition. Sponge, needle, and instrument counts were correct at the end of the case x2. The patient tolerated the procedure well and there were no known complications.  Please note that a surgical assistant was a medical necessity for this procedure in order to perform it in a safe and expeditious manner. Surgical  assistant was necessary to retract the ligaments and  vital neurovascular structures to prevent injury to them and also necessary for proper positioning of the limb to allow for anatomic placement of the prosthesis.

## 2017-11-22 NOTE — Interval H&P Note (Signed)
History and Physical Interval Note:  11/22/2017 10:06 AM  Samantha Potter  has presented today for surgery, with the diagnosis of Degenerative joint disease right knee  The various methods of treatment have been discussed with the patient and family. After consideration of risks, benefits and other options for treatment, the patient has consented to  Procedure(s) with comments: RIGHT TOTAL KNEE ARTHROPLASTY WITH COMPUTER NAVIGATION (Right) - Need RNFA as a surgical intervention .  The patient's history has been reviewed, patient examined, no change in status, stable for surgery.  I have reviewed the patient's chart and labs.  Questions were answered to the patient's satisfaction.     Hilton Cork Mouna Yager

## 2017-11-22 NOTE — Anesthesia Procedure Notes (Signed)
Anesthesia Regional Block: Adductor canal block   Pre-Anesthetic Checklist: ,, timeout performed, Correct Patient, Correct Site, Correct Laterality, Correct Procedure, Correct Position, site marked, Risks and benefits discussed,  Surgical consent,  Pre-op evaluation,  At surgeon's request and post-op pain management  Laterality: Right  Prep: Maximum Sterile Barrier Precautions used, chloraprep       Needles:  Injection technique: Single-shot  Needle Type: Echogenic Needle     Needle Length: 9cm  Needle Gauge: 21     Additional Needles:   Procedures:,,,, ultrasound used (permanent image in chart),,,,  Narrative:  Start time: 11/22/2017 9:02 AM End time: 11/22/2017 9:11 AM Injection made incrementally with aspirations every 5 mL.  Performed by: Personally  Anesthesiologist: Barnet Glasgow, MD

## 2017-11-22 NOTE — Anesthesia Postprocedure Evaluation (Signed)
Anesthesia Post Note  Patient: Samantha Potter  Procedure(s) Performed: RIGHT TOTAL KNEE ARTHROPLASTY WITH COMPUTER NAVIGATION (Right Knee)     Patient location during evaluation: PACU Anesthesia Type: Spinal Level of consciousness: oriented and awake and alert Pain management: pain level controlled Vital Signs Assessment: post-procedure vital signs reviewed and stable Respiratory status: spontaneous breathing, respiratory function stable and patient connected to nasal cannula oxygen Cardiovascular status: blood pressure returned to baseline and stable Postop Assessment: no headache, no backache and no apparent nausea or vomiting Anesthetic complications: no    Last Vitals:  Vitals:   11/22/17 0945 11/22/17 0946  BP:    Pulse: (!) 55 (!) 52  Resp:    Temp:    SpO2: 100% 98%    Last Pain:  Vitals:   11/22/17 0842  TempSrc:   PainSc: 1                  Barnet Glasgow

## 2017-11-22 NOTE — Progress Notes (Signed)
AssistedDr. Houser with right, ultrasound guided, adductor canal block. Side rails up, monitors on throughout procedure. See vital signs in flow sheet. Tolerated Procedure well.  

## 2017-11-22 NOTE — Transfer of Care (Signed)
Immediate Anesthesia Transfer of Care Note  Patient: Samantha Potter  Procedure(s) Performed: Procedure(s) with comments: RIGHT TOTAL KNEE ARTHROPLASTY WITH COMPUTER NAVIGATION (Right) - Need RNFA  Patient Location: PACU  Anesthesia Type:Spinal  Level of Consciousness:  sedated, patient cooperative and responds to stimulation  Airway & Oxygen Therapy:Patient Spontanous Breathing and Patient connected to face mask oxgen  Post-op Assessment:  Report given to PACU RN and Post -op Vital signs reviewed and stable  Post vital signs:  Reviewed and stable  Last Vitals:  Vitals:   11/22/17 0945 11/22/17 0946  BP:    Pulse: (!) 55 (!) 52  Resp:    Temp:    SpO2: 341% 96%    Complications: No apparent anesthesia complications

## 2017-11-23 ENCOUNTER — Encounter (HOSPITAL_COMMUNITY): Payer: Self-pay | Admitting: Orthopedic Surgery

## 2017-11-23 LAB — CBC
HEMATOCRIT: 32.2 % — AB (ref 36.0–46.0)
Hemoglobin: 10.6 g/dL — ABNORMAL LOW (ref 12.0–15.0)
MCH: 30.5 pg (ref 26.0–34.0)
MCHC: 32.9 g/dL (ref 30.0–36.0)
MCV: 92.5 fL (ref 80.0–100.0)
NRBC: 0 % (ref 0.0–0.2)
PLATELETS: 234 10*3/uL (ref 150–400)
RBC: 3.48 MIL/uL — ABNORMAL LOW (ref 3.87–5.11)
RDW: 12.4 % (ref 11.5–15.5)
WBC: 12 10*3/uL — AB (ref 4.0–10.5)

## 2017-11-23 LAB — GLUCOSE, CAPILLARY
Glucose-Capillary: 146 mg/dL — ABNORMAL HIGH (ref 70–99)
Glucose-Capillary: 239 mg/dL — ABNORMAL HIGH (ref 70–99)

## 2017-11-23 LAB — BASIC METABOLIC PANEL
ANION GAP: 8 (ref 5–15)
BUN: 14 mg/dL (ref 6–20)
CALCIUM: 8.5 mg/dL — AB (ref 8.9–10.3)
CO2: 25 mmol/L (ref 22–32)
CREATININE: 0.58 mg/dL (ref 0.44–1.00)
Chloride: 102 mmol/L (ref 98–111)
Glucose, Bld: 176 mg/dL — ABNORMAL HIGH (ref 70–99)
Potassium: 4.1 mmol/L (ref 3.5–5.1)
Sodium: 135 mmol/L (ref 135–145)

## 2017-11-23 MED ORDER — SENNA 8.6 MG PO TABS: 1.0000 | ORAL_TABLET | Freq: Two times a day (BID) | ORAL | 0 refills | Status: AC

## 2017-11-23 MED ORDER — DOCUSATE SODIUM 100 MG PO CAPS: 100.0000 mg | ORAL_CAPSULE | Freq: Two times a day (BID) | ORAL | 1 refills | Status: AC

## 2017-11-23 MED ORDER — HYDROCODONE-ACETAMINOPHEN 5-325 MG PO TABS: 1.0000 | ORAL_TABLET | Freq: Four times a day (QID) | ORAL | 0 refills | Status: AC | PRN

## 2017-11-23 MED ORDER — ASPIRIN 81 MG PO CHEW: 81.0000 mg | CHEWABLE_TABLET | Freq: Two times a day (BID) | ORAL | 1 refills | Status: AC

## 2017-11-23 MED ORDER — ONDANSETRON HCL 4 MG PO TABS: 4.0000 mg | ORAL_TABLET | Freq: Four times a day (QID) | ORAL | 0 refills | Status: AC | PRN

## 2017-11-23 NOTE — Progress Notes (Signed)
    Subjective:  Patient reports pain as mild to moderate.  Denies N/V/CP/SOB. No c/o.  Objective:   VITALS:   Vitals:   11/22/17 2124 11/23/17 0120 11/23/17 0527 11/23/17 0942  BP: (!) 145/83 (!) 142/88 119/68 (!) 161/90  Pulse: 82 62 62 79  Resp: 17 17 16    Temp: 97.8 F (36.6 C) 98.1 F (36.7 C) (!) 97.5 F (36.4 C) 98.1 F (36.7 C)  TempSrc: Oral Oral Oral Oral  SpO2: 100% 100% 100% 99%  Weight:      Height:        NAD ABD soft Sensation intact distally Intact pulses distally Dorsiflexion/Plantar flexion intact Incision: dressing C/D/I Compartment soft   Lab Results  Component Value Date   WBC 12.0 (H) 11/23/2017   HGB 10.6 (L) 11/23/2017   HCT 32.2 (L) 11/23/2017   MCV 92.5 11/23/2017   PLT 234 11/23/2017   BMET    Component Value Date/Time   NA 135 11/23/2017 0433   K 4.1 11/23/2017 0433   CL 102 11/23/2017 0433   CO2 25 11/23/2017 0433   GLUCOSE 176 (H) 11/23/2017 0433   BUN 14 11/23/2017 0433   CREATININE 0.58 11/23/2017 0433   CALCIUM 8.5 (L) 11/23/2017 0433   GFRNONAA >60 11/23/2017 0433   GFRAA >60 11/23/2017 0433     Assessment/Plan: 1 Day Post-Op   Principal Problem:   Osteoarthritis of right knee   WBAT with walker DVT ppx: Aspirin, SCDs, TEDS PO pain control PT/OT DM2: home regimen, SSI PRN Dispo: D/C home with outpatient PT    Hilton Cork Samantha Potter 11/23/2017, 10:36 AM   Rod Can, MD Cell [phone]

## 2017-11-23 NOTE — Evaluation (Signed)
Physical Therapy Evaluation Patient Details Name: Samantha Potter MRN: [id number] DOB: Jul 21, 1960 Today's Date: 11/23/2017   History of Present Illness  Pt s/p R TKR and with hx of DM and breast CA  Clinical Impression  Pt a/p R TKR and presents with decreased R LE strength/ROM and post op pain limiting functional mobility.  Pt should progress well to dc home with family assist and plans OP PT to initiate 11/26/17.    Follow Up Recommendations Follow surgeon's recommendation for DC plan and follow-up therapies    Equipment Recommendations  None recommended by PT    Recommendations for Other Services       Precautions / Restrictions Precautions Precautions: Fall Restrictions Weight Bearing Restrictions: No Other Position/Activity Restrictions: WBAT       Mobility  Bed Mobility Overal bed mobility: Needs Assistance Bed Mobility: Supine to Sit     Supine to sit: Min guard     General bed mobility comments: min cues for sequence with min guard for R LE  Transfers Overall transfer level: Needs assistance   Transfers: Sit to/from Stand;Stand Pivot Transfers Sit to Stand: Min assist Stand pivot transfers: Min assist       General transfer comment: cues for LE management and use of UEs to self assist.  Stand/pvt bed to [facility]  Ambulation/Gait Ambulation/Gait assistance: Min assist;Min guard Gait Distance (Feet): 90 Feet Assistive device: Rolling walker (2 wheeled) Gait Pattern/deviations: Step-to pattern;Step-through pattern;Shuffle;Trunk flexed Gait velocity: decr   General Gait Details: cues for sequence, posture and position from [employer]            Wheelchair Mobility    Modified Rankin (Stroke Patients Only)       Balance Overall balance assessment: No apparent balance deficits (not formally assessed)                                           Pertinent Vitals/Pain Pain Assessment: 0-10 Pain Score: 4  Pain Location: R  knee Pain Descriptors / Indicators: Aching;Sore Pain Intervention(s): Limited activity within patient's tolerance;Monitored during session;Premedicated before session;Ice applied    Home Living Family/patient expects to be discharged to:: Private residence Living Arrangements: Spouse/significant other Available Help at Discharge: Family Type of Home: House Home Access: Stairs to enter Entrance Stairs-Rails: Psychiatric nurse of Steps: 3 Home Layout: One level Home Equipment: Environmental consultant - 2 wheels;Cane - single point      Prior Function Level of Independence: Independent               Hand Dominance        Extremity/Trunk Assessment   Upper Extremity Assessment Upper Extremity Assessment: Overall WFL for tasks assessed    Lower Extremity Assessment Lower Extremity Assessment: RLE deficits/detail RLE Deficits / Details: 3/5 quads with IND SLR; AAROM at R knee -10 - 70    Cervical / Trunk Assessment Cervical / Trunk Assessment: Normal  Communication   Communication: No difficulties  Cognition Arousal/Alertness: Awake/alert Behavior During Therapy: WFL for tasks assessed/performed Overall Cognitive Status: Within Functional Limits for tasks assessed                                        General Comments      Exercises Total Joint Exercises Ankle Circles/Pumps: AROM;Both;20 reps;Supine Quad Sets:  AROM;Both;10 reps;Supine Heel Slides: AAROM;Right;15 reps;Supine Straight Leg Raises: AAROM;AROM;Right;15 reps;Supine   Assessment/Plan    PT Assessment Patient needs continued PT services  PT Problem List Decreased strength;Decreased range of motion;Decreased activity tolerance;Decreased mobility;Decreased knowledge of use of DME;Pain       PT Treatment Interventions DME instruction;Gait training;Stair training;Functional mobility training;Therapeutic activities;Therapeutic exercise;Patient/family education    PT Goals (Current  goals can be found in the Care Plan section)  Acute Rehab PT Goals Patient Stated Goal: Regain IND PT Goal Formulation: With patient Time For Goal Achievement: 11/30/17 Potential to Achieve Goals: Good    Frequency 7X/week   Barriers to discharge        Co-evaluation               AM-PAC PT "6 Clicks" Daily Activity  Outcome Measure Difficulty turning over in bed (including adjusting bedclothes, sheets and blankets)?: A Lot Difficulty moving from lying on back to sitting on the side of the bed? : A Lot Difficulty sitting down on and standing up from a chair with arms (e.g., wheelchair, bedside commode, etc,.)?: A Lot Help needed moving to and from a bed to chair (including a wheelchair)?: A Little Help needed walking in hospital room?: A Little Help needed climbing 3-5 steps with a railing? : A Little 6 Click Score: 15    End of Session Equipment Utilized During Treatment: Gait belt Activity Tolerance: Patient tolerated treatment well Patient left: in chair;with call bell/phone within reach;with family/visitor present Nurse Communication: Mobility status PT Visit Diagnosis: Difficulty in walking, not elsewhere classified (R26.2)    Time: 1314-3888 PT Time Calculation (min) (ACUTE ONLY): 29 min   Charges:   PT Evaluation $PT Eval Low Complexity: 1 Low PT Treatments $Therapeutic Exercise: 8-22 mins        Debe Coder PT Acute Rehabilitation Services Pager [phone] Office [phone]   Abisola Carrero 11/23/2017, 12:24 PM

## 2017-11-23 NOTE — Progress Notes (Signed)
Physical Therapy Treatment Patient Details Name: Samantha Potter MRN: [id number] DOB: 1961-01-21 Today's Date: 11/23/2017    History of Present Illness Pt s/p R TKR and with hx of DM and breast CA    PT Comments    Pt progressing well with mobility.  Spouse present and reviewed stairs and home therex with written instruction provided.   Follow Up Recommendations  Follow surgeon's recommendation for DC plan and follow-up therapies     Equipment Recommendations  None recommended by PT    Recommendations for Other Services       Precautions / Restrictions Precautions Precautions: Fall Restrictions Weight Bearing Restrictions: No Other Position/Activity Restrictions: WBAT     Mobility  Bed Mobility Overal bed mobility: Needs Assistance Bed Mobility: Supine to Sit     Supine to sit: Supervision     General bed mobility comments: min cues for sequence with min guard for R LE  Transfers Overall transfer level: Needs assistance   Transfers: Sit to/from Stand;Stand Pivot Transfers Sit to Stand: Min guard;Supervision Stand pivot transfers: Min assist       General transfer comment: cues for LE management and use of UEs to self assist.  Stand/pvt bed to [facility]  Ambulation/Gait Ambulation/Gait assistance: Min guard;Supervision Gait Distance (Feet): 120 Feet Assistive device: Rolling walker (2 wheeled) Gait Pattern/deviations: Step-to pattern;Step-through pattern;Shuffle;Trunk flexed Gait velocity: decr   General Gait Details: cues for sequence, posture and position from RW   Stairs Stairs: Yes Stairs assistance: Min assist Stair Management: One rail Left;Step to pattern;Forwards;With cane Number of Stairs: 5 General stair comments: cues for sequence and foot/cane placement   Wheelchair Mobility    Modified Rankin (Stroke Patients Only)       Balance Overall balance assessment: No apparent balance deficits (not formally assessed)                                           Cognition Arousal/Alertness: Awake/alert Behavior During Therapy: WFL for tasks assessed/performed Overall Cognitive Status: Within Functional Limits for tasks assessed                                        Exercises Total Joint Exercises Ankle Circles/Pumps: AROM;Both;20 reps;Supine Quad Sets: AROM;Both;10 reps;Supine Heel Slides: AAROM;Right;15 reps;Supine Straight Leg Raises: AAROM;AROM;Right;15 reps;Supine Long Arc Quad: AROM;Right;10 reps;Seated Knee Flexion: AROM;AAROM;Right;10 reps;Seated    General Comments        Pertinent Vitals/Pain Pain Assessment: 0-10 Pain Score: 3  Pain Location: R knee Pain Descriptors / Indicators: Aching;Sore Pain Intervention(s): Limited activity within patient's tolerance;Monitored during session;Premedicated before session;Ice applied    Home Living Family/patient expects to be discharged to:: Private residence Living Arrangements: Spouse/significant other Available Help at Discharge: Family Type of Home: House Home Access: Stairs to enter Entrance Stairs-Rails: Right;Left Home Layout: One level Home Equipment: Environmental consultant - 2 wheels;Cane - single point      Prior Function Level of Independence: Independent          PT Goals (current goals can now be found in the care plan section) Acute Rehab PT Goals Patient Stated Goal: Regain IND PT Goal Formulation: With patient Time For Goal Achievement: 11/30/17 Potential to Achieve Goals: Good Progress towards PT goals: Progressing toward goals    Frequency    7X/week  PT Plan Current plan remains appropriate    Co-evaluation              AM-PAC PT "6 Clicks" Daily Activity  Outcome Measure  Difficulty turning over in bed (including adjusting bedclothes, sheets and blankets)?: A Lot Difficulty moving from lying on back to sitting on the side of the bed? : A Lot Difficulty sitting down on and standing up from a  chair with arms (e.g., wheelchair, bedside commode, etc,.)?: A Lot Help needed moving to and from a bed to chair (including a wheelchair)?: A Little Help needed walking in hospital room?: A Little Help needed climbing 3-5 steps with a railing? : A Little 6 Click Score: 15    End of Session Equipment Utilized During Treatment: Gait belt Activity Tolerance: Patient tolerated treatment well Patient left: in chair;with call bell/phone within reach;with family/visitor present Nurse Communication: Mobility status PT Visit Diagnosis: Difficulty in walking, not elsewhere classified (R26.2)     Time: 1410-1446 PT Time Calculation (min) (ACUTE ONLY): 36 min  Charges:  $Gait Training: 8-22 mins $Therapeutic Exercise: 8-22 mins                     [place] Pager [phone] Office [phone]    Liba Hulsey 11/23/2017, 3:36 PM

## 2017-11-23 NOTE — Discharge Summary (Signed)
Physician Discharge Summary  Patient ID: Samantha Potter MRN: [id number] DOB/AGE: [birth year] 57 y.o.  Admit date: 11/22/2017 Discharge date: 11/23/2017  Admission Diagnoses:  Osteoarthritis of right knee  Discharge Diagnoses:  Principal Problem:   Osteoarthritis of right knee   Past Medical History:  Diagnosis Date  . Abnormal EKG 09/24/2017  . Arthritis   . Breast cancer ([facility]) 09/24/2017   hx of chemo and radiation   . Diabetes ([place]) 09/24/2017  . Essential hypertension 09/24/2017  . Preop cardiovascular exam 09/24/2017    Surgeries: Procedure(s): RIGHT TOTAL KNEE ARTHROPLASTY WITH COMPUTER NAVIGATION on 11/22/2017   Consultants (if any):   Discharged Condition: Improved  Hospital Course: Samantha Potter is an 57 y.o. female who was admitted 11/22/2017 with a diagnosis of Osteoarthritis of right knee and went to the operating room on 11/22/2017 and underwent the above named procedures.    She was given perioperative antibiotics:  Anti-infectives (From admission, onward)   Start     Dose/Rate Route Frequency Ordered Stop   11/22/17 1800  clindamycin (CLEOCIN) IVPB 600 mg     600 mg 100 mL/hr over 30 Minutes Intravenous Every 6 hours 11/22/17 1640 11/23/17 0015   11/22/17 0830  clindamycin (CLEOCIN) IVPB 900 mg  Status:  Discontinued     900 mg 100 mL/hr over 30 Minutes Intravenous On call to O.R. 11/22/17 5277 11/22/17 1637    .  She was given sequential compression devices, early ambulation, and ASA for DVT prophylaxis.  She benefited maximally from the hospital stay and there were no complications.    Recent vital signs:  Vitals:   11/23/17 0527 11/23/17 0942  BP: 119/68 (!) 161/90  Pulse: 62 79  Resp: 16   Temp: (!) 97.5 F (36.4 C) 98.1 F (36.7 C)  SpO2: 100% 99%    Recent laboratory studies:  Lab Results  Component Value Date   HGB 10.6 (L) 11/23/2017   HGB 13.2 11/15/2017   Lab Results  Component Value Date   WBC 12.0 (H) 11/23/2017   PLT  234 11/23/2017   No results found for: INR Lab Results  Component Value Date   NA 135 11/23/2017   K 4.1 11/23/2017   CL 102 11/23/2017   CO2 25 11/23/2017   BUN 14 11/23/2017   CREATININE 0.58 11/23/2017   GLUCOSE 176 (H) 11/23/2017    Discharge Medications:   Allergies as of 11/23/2017      Reactions   Penicillins Hives   Has patient had a PCN reaction causing immediate rash, facial/tongue/throat swelling, SOB or lightheadedness with hypotension: No Has patient had a PCN reaction causing severe rash involving mucus membranes or skin necrosis: No Has patient had a PCN reaction that required hospitalization: No Has patient had a PCN reaction occurring within the last 10 years: No If all of the above answers are "NO", then may proceed with Cephalosporin use.      Medication List    TAKE these medications   acetaminophen 500 MG tablet Commonly known as:  TYLENOL Take 500 mg by mouth 2 (two) times daily.   aspirin 81 MG chewable tablet Chew 1 tablet (81 mg total) by mouth 2 (two) times daily.   BIOFREEZE EX Apply 1 application topically 3 (three) times daily as needed (pain.).   CINNAMON PO Take 1 tablet by mouth daily.   docusate sodium 100 MG capsule Commonly known as:  COLACE Take 1 capsule (100 mg total) by mouth 2 (two) times daily.   HYDROcodone-acetaminophen 5-325  MG tablet Commonly known as:  NORCO/VICODIN Take 1-2 tablets by mouth every 6 (six) hours as needed for moderate pain (pain score 4-6).   lisinopril 5 MG tablet Commonly known as:  PRINIVIL,ZESTRIL Take 5 mg by mouth daily.   metFORMIN 500 MG tablet Commonly known as:  GLUCOPHAGE Take 1,000 mg by mouth 2 (two) times daily.   multivitamin with minerals Tabs tablet Take 1 tablet by mouth daily.   ondansetron 4 MG tablet Commonly known as:  ZOFRAN Take 1 tablet (4 mg total) by mouth every 6 (six) hours as needed for nausea.   OVER THE COUNTER MEDICATION Take 1 capsule by mouth See admin  instructions. Take 1 capsule by mouth 2-3 times daily with meals. Sucontral D (B-vitamins, folic acid, vitamins C and E, as well as trace minerals zinc and chromium)   senna 8.6 MG Tabs tablet Commonly known as:  SENOKOT Take 1 tablet (8.6 mg total) by mouth 2 (two) times daily.       Diagnostic Studies: Dg Knee Right Port  Result Date: 11/22/2017 CLINICAL DATA:  Total right knee replacement. EXAM: PORTABLE RIGHT KNEE - 1-2 VIEW COMPARISON:  MRI 02/09/2015. FINDINGS: Total right knee replacement with anatomic alignment. Hardware intact. Acute bony abnormality. IMPRESSION: Total right knee replacement anatomic alignment. Electronically Signed   By: Marcello Moores  Register   On: 11/22/2017 15:06    Disposition: Discharge disposition: 01-Home or Self Care       Discharge Instructions    Call MD / Call 911   Complete by:  As directed    If you experience chest pain or shortness of breath, CALL 911 and be transported to the hospital emergency room.  If you develope a fever above 101 F, pus (white drainage) or increased drainage or redness at the wound, or calf pain, call your surgeon's office.   Constipation Prevention   Complete by:  As directed    Drink plenty of fluids.  Prune juice may be helpful.  You may use a stool softener, such as Colace (over the counter) 100 mg twice a day.  Use MiraLax (over the counter) for constipation as needed.   Diet - low sodium heart healthy   Complete by:  As directed    Do not put a pillow under the knee. Place it under the heel.   Complete by:  As directed    Driving restrictions   Complete by:  As directed    No driving for 4 weeks   Increase activity slowly as tolerated   Complete by:  As directed    Lifting restrictions   Complete by:  As directed    No lifting for 6 weeks   TED hose   Complete by:  As directed    Use stockings (TED hose) for 2 weeks on both leg(s).  You may remove them at night for sleeping.      Follow-up Information     Randilyn Foisy, Aaron Edelman, MD. Schedule an appointment as soon as possible for a visit in 2 weeks.   Specialty:  Orthopedic Surgery Why:  For wound re-check Contact information: [address] [place] [address] [phone]            Signed: Hilton Cork Alex Leahy 11/23/2017, 10:44 AM

## 2017-11-26 DIAGNOSIS — Z96651 Presence of right artificial knee joint: Secondary | ICD-10-CM | POA: Diagnosis not present

## 2017-11-26 DIAGNOSIS — R2689 Other abnormalities of gait and mobility: Secondary | ICD-10-CM | POA: Diagnosis not present

## 2017-11-26 DIAGNOSIS — M1711 Unilateral primary osteoarthritis, right knee: Secondary | ICD-10-CM | POA: Diagnosis not present

## 2017-11-26 DIAGNOSIS — M62551 Muscle wasting and atrophy, not elsewhere classified, right thigh: Secondary | ICD-10-CM | POA: Diagnosis not present

## 2017-11-28 DIAGNOSIS — Z96651 Presence of right artificial knee joint: Secondary | ICD-10-CM | POA: Diagnosis not present

## 2017-11-28 DIAGNOSIS — M62551 Muscle wasting and atrophy, not elsewhere classified, right thigh: Secondary | ICD-10-CM | POA: Diagnosis not present

## 2017-11-28 DIAGNOSIS — R2689 Other abnormalities of gait and mobility: Secondary | ICD-10-CM | POA: Diagnosis not present

## 2017-11-28 DIAGNOSIS — M1711 Unilateral primary osteoarthritis, right knee: Secondary | ICD-10-CM | POA: Diagnosis not present

## 2017-11-30 DIAGNOSIS — M1711 Unilateral primary osteoarthritis, right knee: Secondary | ICD-10-CM | POA: Diagnosis not present

## 2017-11-30 DIAGNOSIS — M62551 Muscle wasting and atrophy, not elsewhere classified, right thigh: Secondary | ICD-10-CM | POA: Diagnosis not present

## 2017-11-30 DIAGNOSIS — R2689 Other abnormalities of gait and mobility: Secondary | ICD-10-CM | POA: Diagnosis not present

## 2017-11-30 DIAGNOSIS — Z96651 Presence of right artificial knee joint: Secondary | ICD-10-CM | POA: Diagnosis not present

## 2017-12-03 DIAGNOSIS — R2689 Other abnormalities of gait and mobility: Secondary | ICD-10-CM | POA: Diagnosis not present

## 2017-12-03 DIAGNOSIS — Z96651 Presence of right artificial knee joint: Secondary | ICD-10-CM | POA: Diagnosis not present

## 2017-12-03 DIAGNOSIS — M62551 Muscle wasting and atrophy, not elsewhere classified, right thigh: Secondary | ICD-10-CM | POA: Diagnosis not present

## 2017-12-03 DIAGNOSIS — M1711 Unilateral primary osteoarthritis, right knee: Secondary | ICD-10-CM | POA: Diagnosis not present

## 2017-12-05 DIAGNOSIS — M1711 Unilateral primary osteoarthritis, right knee: Secondary | ICD-10-CM | POA: Diagnosis not present

## 2017-12-05 DIAGNOSIS — R2689 Other abnormalities of gait and mobility: Secondary | ICD-10-CM | POA: Diagnosis not present

## 2017-12-05 DIAGNOSIS — Z96651 Presence of right artificial knee joint: Secondary | ICD-10-CM | POA: Diagnosis not present

## 2017-12-05 DIAGNOSIS — M62551 Muscle wasting and atrophy, not elsewhere classified, right thigh: Secondary | ICD-10-CM | POA: Diagnosis not present

## 2017-12-07 DIAGNOSIS — M62551 Muscle wasting and atrophy, not elsewhere classified, right thigh: Secondary | ICD-10-CM | POA: Diagnosis not present

## 2017-12-07 DIAGNOSIS — Z96651 Presence of right artificial knee joint: Secondary | ICD-10-CM | POA: Diagnosis not present

## 2017-12-07 DIAGNOSIS — M1711 Unilateral primary osteoarthritis, right knee: Secondary | ICD-10-CM | POA: Diagnosis not present

## 2017-12-07 DIAGNOSIS — R2689 Other abnormalities of gait and mobility: Secondary | ICD-10-CM | POA: Diagnosis not present

## 2017-12-10 DIAGNOSIS — Z96651 Presence of right artificial knee joint: Secondary | ICD-10-CM | POA: Diagnosis not present

## 2017-12-10 DIAGNOSIS — Z471 Aftercare following joint replacement surgery: Secondary | ICD-10-CM | POA: Diagnosis not present

## 2017-12-11 DIAGNOSIS — R2689 Other abnormalities of gait and mobility: Secondary | ICD-10-CM | POA: Diagnosis not present

## 2017-12-11 DIAGNOSIS — M1711 Unilateral primary osteoarthritis, right knee: Secondary | ICD-10-CM | POA: Diagnosis not present

## 2017-12-11 DIAGNOSIS — M62551 Muscle wasting and atrophy, not elsewhere classified, right thigh: Secondary | ICD-10-CM | POA: Diagnosis not present

## 2017-12-11 DIAGNOSIS — Z96651 Presence of right artificial knee joint: Secondary | ICD-10-CM | POA: Diagnosis not present

## 2017-12-26 DIAGNOSIS — Z6837 Body mass index (BMI) 37.0-37.9, adult: Secondary | ICD-10-CM | POA: Diagnosis not present

## 2017-12-26 DIAGNOSIS — R000 Tachycardia, unspecified: Secondary | ICD-10-CM | POA: Diagnosis not present

## 2018-01-07 DIAGNOSIS — Z96651 Presence of right artificial knee joint: Secondary | ICD-10-CM | POA: Diagnosis not present

## 2018-01-07 DIAGNOSIS — Z471 Aftercare following joint replacement surgery: Secondary | ICD-10-CM | POA: Diagnosis not present

## 2018-01-09 ENCOUNTER — Ambulatory Visit (INDEPENDENT_AMBULATORY_CARE_PROVIDER_SITE_OTHER): Payer: BLUE CROSS/BLUE SHIELD

## 2018-01-09 ENCOUNTER — Other Ambulatory Visit: Payer: Self-pay | Admitting: *Deleted

## 2018-01-09 DIAGNOSIS — R000 Tachycardia, unspecified: Secondary | ICD-10-CM

## 2018-01-21 ENCOUNTER — Telehealth: Payer: Self-pay | Admitting: *Deleted

## 2018-01-21 NOTE — Telephone Encounter (Signed)
Faxed over monitor results ordered by Haydee Salter, resulted by Dr. Bettina Gavia.

## 2018-02-08 DIAGNOSIS — Z79899 Other long term (current) drug therapy: Secondary | ICD-10-CM | POA: Diagnosis not present

## 2018-02-08 DIAGNOSIS — E119 Type 2 diabetes mellitus without complications: Secondary | ICD-10-CM | POA: Diagnosis not present

## 2018-02-08 DIAGNOSIS — I10 Essential (primary) hypertension: Secondary | ICD-10-CM | POA: Diagnosis not present

## 2018-02-08 DIAGNOSIS — E785 Hyperlipidemia, unspecified: Secondary | ICD-10-CM | POA: Diagnosis not present

## 2018-02-12 ENCOUNTER — Telehealth: Payer: Self-pay | Admitting: Cardiology

## 2018-02-12 NOTE — Telephone Encounter (Signed)
Left message advising patient to contact ordering provider's office, Haydee Salter, NP, for results. Informed her to contact our office with any further questions or concerns.

## 2018-02-12 NOTE — Telephone Encounter (Signed)
Wants monitor results °

## 2018-06-05 DIAGNOSIS — M25512 Pain in left shoulder: Secondary | ICD-10-CM | POA: Diagnosis not present

## 2018-06-05 DIAGNOSIS — M5412 Radiculopathy, cervical region: Secondary | ICD-10-CM | POA: Diagnosis not present

## 2018-08-19 DIAGNOSIS — E119 Type 2 diabetes mellitus without complications: Secondary | ICD-10-CM | POA: Diagnosis not present

## 2018-08-19 DIAGNOSIS — E785 Hyperlipidemia, unspecified: Secondary | ICD-10-CM | POA: Diagnosis not present

## 2018-08-19 DIAGNOSIS — E669 Obesity, unspecified: Secondary | ICD-10-CM | POA: Diagnosis not present

## 2018-08-19 DIAGNOSIS — Z79899 Other long term (current) drug therapy: Secondary | ICD-10-CM | POA: Diagnosis not present

## 2018-08-19 DIAGNOSIS — I10 Essential (primary) hypertension: Secondary | ICD-10-CM | POA: Diagnosis not present

## 2018-10-07 DIAGNOSIS — Z20828 Contact with and (suspected) exposure to other viral communicable diseases: Secondary | ICD-10-CM | POA: Diagnosis not present

## 2019-02-20 DIAGNOSIS — I10 Essential (primary) hypertension: Secondary | ICD-10-CM | POA: Diagnosis not present

## 2019-02-20 DIAGNOSIS — M79622 Pain in left upper arm: Secondary | ICD-10-CM | POA: Diagnosis not present

## 2019-02-20 DIAGNOSIS — E1165 Type 2 diabetes mellitus with hyperglycemia: Secondary | ICD-10-CM | POA: Diagnosis not present

## 2019-02-20 DIAGNOSIS — E785 Hyperlipidemia, unspecified: Secondary | ICD-10-CM | POA: Diagnosis not present

## 2019-02-20 DIAGNOSIS — Z79899 Other long term (current) drug therapy: Secondary | ICD-10-CM | POA: Diagnosis not present

## 2019-04-04 DIAGNOSIS — R748 Abnormal levels of other serum enzymes: Secondary | ICD-10-CM | POA: Diagnosis not present

## 2019-05-22 DIAGNOSIS — E1165 Type 2 diabetes mellitus with hyperglycemia: Secondary | ICD-10-CM | POA: Diagnosis not present

## 2019-07-23 DIAGNOSIS — L219 Seborrheic dermatitis, unspecified: Secondary | ICD-10-CM | POA: Diagnosis not present

## 2019-07-23 DIAGNOSIS — E119 Type 2 diabetes mellitus without complications: Secondary | ICD-10-CM | POA: Diagnosis not present

## 2019-07-23 DIAGNOSIS — E785 Hyperlipidemia, unspecified: Secondary | ICD-10-CM | POA: Diagnosis not present

## 2019-07-23 DIAGNOSIS — I10 Essential (primary) hypertension: Secondary | ICD-10-CM | POA: Diagnosis not present

## 2019-08-05 DIAGNOSIS — L219 Seborrheic dermatitis, unspecified: Secondary | ICD-10-CM | POA: Diagnosis not present

## 2019-08-05 DIAGNOSIS — L821 Other seborrheic keratosis: Secondary | ICD-10-CM | POA: Diagnosis not present

## 2019-08-26 DIAGNOSIS — E1165 Type 2 diabetes mellitus with hyperglycemia: Secondary | ICD-10-CM | POA: Diagnosis not present

## 2019-08-26 DIAGNOSIS — Z79899 Other long term (current) drug therapy: Secondary | ICD-10-CM | POA: Diagnosis not present

## 2019-09-16 DIAGNOSIS — E119 Type 2 diabetes mellitus without complications: Secondary | ICD-10-CM | POA: Diagnosis not present

## 2019-12-05 IMAGING — DX DG KNEE 1-2V PORT*R*
2 series · 2 of 2 positions shown · non-contrast
Comparison: MRI 02/09/2015.

CLINICAL DATA: Total right knee replacement.

EXAM:
PORTABLE RIGHT KNEE - 1-2 VIEW

[knee lat (1 of 2)]
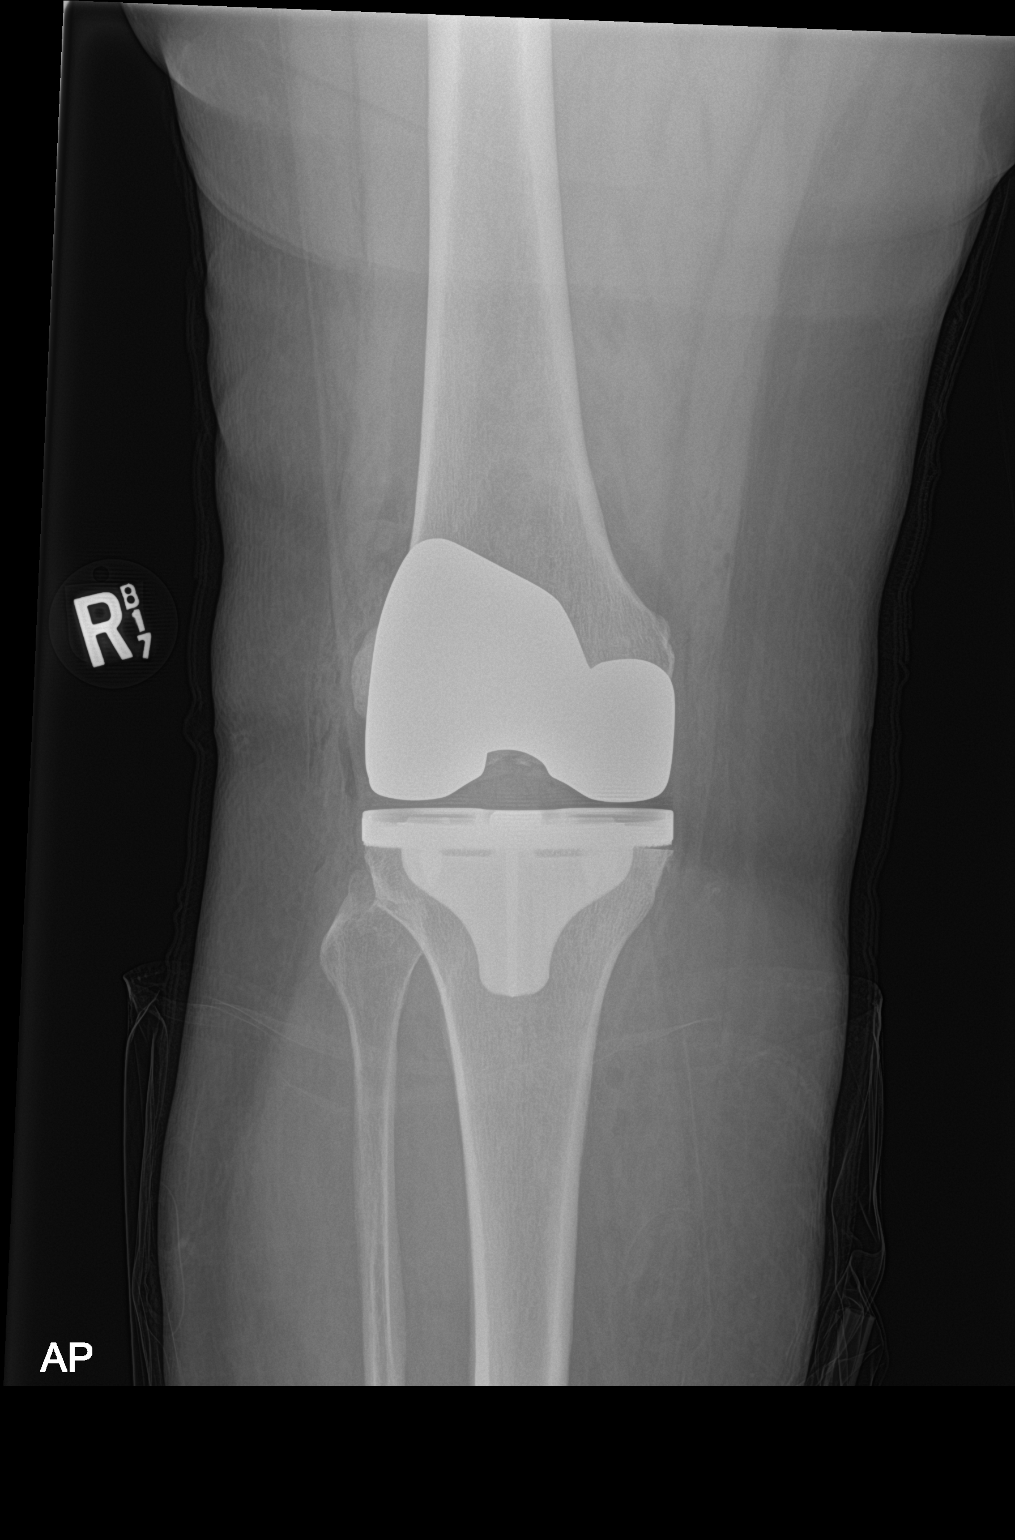

[knee lat (2 of 2)]
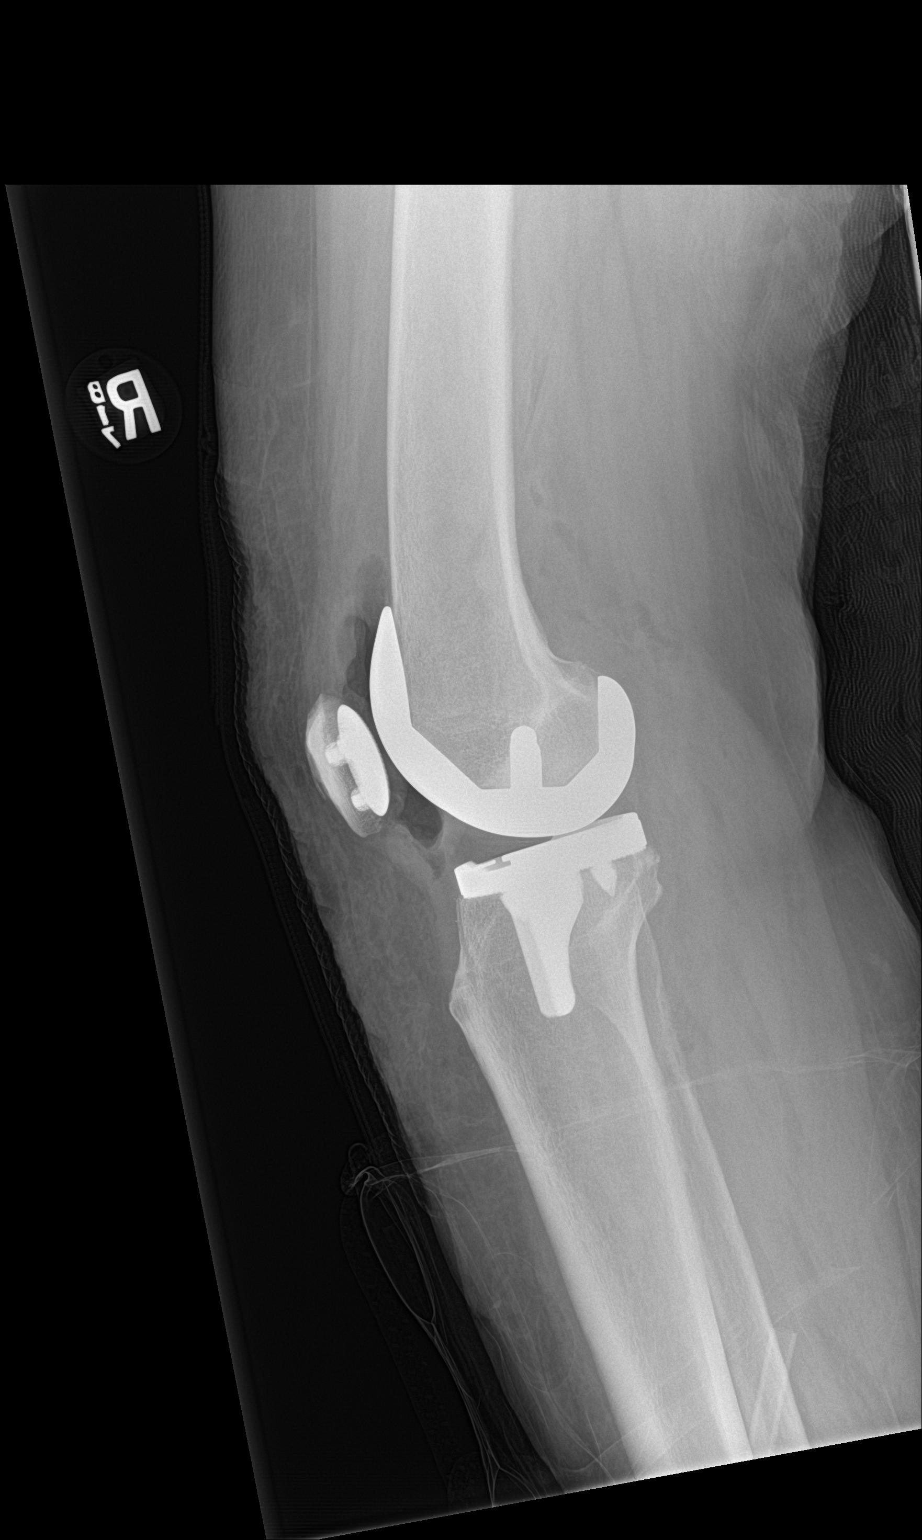

[2 of 2 positions shown; findings below may reference images not displayed]

FINDINGS: Total right knee replacement with anatomic alignment. Hardware
intact. Acute bony abnormality.
IMPRESSION: Total right knee replacement anatomic alignment.

## 2020-03-04 DIAGNOSIS — Z20822 Contact with and (suspected) exposure to covid-19: Secondary | ICD-10-CM | POA: Diagnosis not present
